# Patient Record
Sex: Male | Born: 1955
Health system: Southern US, Community
[De-identification: ages and names within clinical notes are randomized; demographics above are authoritative.]

## PROBLEM LIST (undated history)

## (undated) DIAGNOSIS — I517 Cardiomegaly: Secondary | ICD-10-CM

## (undated) DIAGNOSIS — R7302 Impaired glucose tolerance (oral): Secondary | ICD-10-CM

## (undated) DIAGNOSIS — E785 Hyperlipidemia, unspecified: Secondary | ICD-10-CM

## (undated) DIAGNOSIS — H9193 Unspecified hearing loss, bilateral: Secondary | ICD-10-CM

## (undated) DIAGNOSIS — I10 Essential (primary) hypertension: Secondary | ICD-10-CM

## (undated) DIAGNOSIS — R0600 Dyspnea, unspecified: Secondary | ICD-10-CM

## (undated) HISTORY — DX: Impaired glucose tolerance (oral): R73.02

## (undated) HISTORY — PX: COLONOSCOPY: SHX174

## (undated) HISTORY — DX: Unspecified hearing loss, bilateral: H91.93

## (undated) HISTORY — DX: Cardiomegaly: I51.7

## (undated) HISTORY — DX: Essential (primary) hypertension: I10

---

## 2017-04-17 ENCOUNTER — Encounter: Payer: Self-pay | Admitting: Internal Medicine

## 2017-05-10 ENCOUNTER — Encounter: Payer: Self-pay | Admitting: *Deleted

## 2017-05-10 ENCOUNTER — Encounter (INDEPENDENT_AMBULATORY_CARE_PROVIDER_SITE_OTHER): Payer: Self-pay

## 2017-05-10 ENCOUNTER — Ambulatory Visit (INDEPENDENT_AMBULATORY_CARE_PROVIDER_SITE_OTHER): Payer: BLUE CROSS/BLUE SHIELD | Admitting: Internal Medicine

## 2017-05-10 VITALS — BP 136/74 | HR 58 | Ht 68.0 in | Wt 242.0 lb

## 2017-05-10 DIAGNOSIS — I10 Essential (primary) hypertension: Secondary | ICD-10-CM | POA: Diagnosis not present

## 2017-05-10 DIAGNOSIS — I517 Cardiomegaly: Secondary | ICD-10-CM | POA: Diagnosis not present

## 2017-05-10 MED ORDER — HYDROCHLOROTHIAZIDE 25 MG PO TABS: 25.0000 mg | ORAL_TABLET | Freq: Every day | ORAL | 6 refills | Status: DC

## 2017-05-10 MED ORDER — PRAVASTATIN SODIUM 40 MG PO TABS: 40.0000 mg | ORAL_TABLET | Freq: Every day | ORAL | 6 refills | Status: DC

## 2017-05-10 MED ORDER — CARVEDILOL 25 MG PO TABS: 25.0000 mg | ORAL_TABLET | Freq: Two times a day (BID) | ORAL | 6 refills | Status: DC

## 2017-05-10 MED ORDER — AMLODIPINE BESY-BENAZEPRIL HCL 10-40 MG PO CAPS: 1.0000 | ORAL_CAPSULE | Freq: Every day | ORAL | 6 refills | Status: DC

## 2017-05-10 NOTE — Patient Instructions (Signed)
Medication Instructions:  Your physician recommends that you continue on your current medications as directed. Please refer to the Current Medication list given to you today.   Labwork: None   Testing/Procedures: None   Follow-Up: Your physician wants you to follow-up in: 6 months with Dr End. (December 2018).  You will receive a reminder letter in the mail two months in advance. If you don't receive a letter, please call our office to schedule the follow-up appointment.        If you need a refill on your cardiac medications before your next appointment, please call your pharmacy.

## 2017-05-10 NOTE — Progress Notes (Signed)
New Outpatient Visit Date: 05/10/2017  Referring Provider: Daiva Eves, MD [facility] [phone] [place], [place] [address]  Chief Complaint: Establish cardiology care  HPI:  [name] is a 61 y.o. male who is being seen today for the evaluation of cardiomegaly at the request of Dr. Lisbeth Gonzalez. He has a history of cardiomegaly and essential hypertension. He was first diagnosed with cardiomegaly a proximally 5 years ago while living in [place]. He presented with progressive shortness of breath and underwent extensive stress testing. The patient reports that his heart was not weekend but enlarged. This subsequently resolved with appropriate treatment of his high blood pressure. Since moving to [place] about a year ago, he has not had any shortness of breath, chest pain, palpitations, lightheadedness, orthopnea, PND, or claudication. He experiences occasional dependent edema when he has been on his feet for an extended period. He also has intermittent pain.  The patient does not recall specifics of his testing in [place], though he believes he had at least one echocardiogram. He does not believe that he underwent cardiac catheterization, though Dr. Onnie Gonzalez note comments on a cath 1.5 years ago that was negative for significant CAD.  --------------------------------------------------------------------------------------------------  Cardiovascular History & Procedures: Cardiovascular Problems:  Cardiomegaly  Risk Factors:  Hypertension, male gender, and age> 33  Cath/PCI:  None available  CV Surgery:  None  EP Procedures and Devices:  None  Non-Invasive Evaluation(s):  None available  Recent CV Pertinent Labs: See below.  --------------------------------------------------------------------------------------------------  Past Medical History:  Diagnosis Date  . Cardiomegaly   . Essential hypertension   . Unspecified hearing loss, bilateral      No past surgical history on file.  Outpatient Encounter Prescriptions as of 05/10/2017  Medication Sig  . amLODipine-benazepril (LOTREL) 10-40 MG capsule Take 1 capsule by mouth daily.  Marland Kitchen aspirin EC 81 MG tablet Take 81 mg by mouth daily.  . carvedilol (COREG) 25 MG tablet Take 25 mg by mouth 2 (two) times daily with a meal.  . hydrochlorothiazide (HYDRODIURIL) 25 MG tablet Take 25 mg by mouth daily.  . pravastatin (PRAVACHOL) 40 MG tablet Take 40 mg by mouth daily.  . [DISCONTINUED] cephALEXin (KEFLEX) 500 MG capsule Take 500 mg by mouth 3 (three) times daily.   No facility-administered encounter medications on file as of 05/10/2017.     Allergies: Patient has no known allergies.  Social History   Social History  . Marital status: Married    Spouse name: N/A  . Number of children: N/A  . Years of education: N/A   Occupational History  . Not on file.   Social History Main Topics  . Smoking status: Former Smoker    Packs/day: 2.00    Years: 20.00    Types: Cigarettes    Quit date: 1998  . Smokeless tobacco: Never Used  . Alcohol use No  . Drug use: No  . Sexual activity: Not on file   Other Topics Concern  . Not on file   Social History Narrative  . No narrative on file    Family History  Problem Relation Age of Onset  . Blindness Mother   . Glaucoma Mother   . Brain cancer Father     Review of Systems: A 12-system review of systems was performed and was negative except as noted in the HPI.  --------------------------------------------------------------------------------------------------  Physical Exam: BP 136/74   Pulse (!) 58   Ht 5\' 8"  (1.727 m)   Wt 242 lb (109.8 kg)  BMI 36.80 kg/m   General:  Obese man, seated comfortably in the exam room. HEENT: No conjunctival pallor or scleral icterus.  Moist mucous membranes.  OP clear. Neck: Supple without lymphadenopathy, thyromegaly, JVD, or HJR.  No carotid bruit. Lungs: Normal work of breathing.   Clear to auscultation bilaterally without wheezes or crackles. Heart: Regular rate and rhythm without murmurs, rubs, or gallops. Unable to assess PMI due to body habitus. Abd: Bowel sounds present.  Soft, NT/ND. I able to assess HSM due to body habitus. Ext: Trace ankle edema bilaterally.  Radial, PT, and DP pulses are 2+ bilaterally Skin: warm and dry without rash. Ill-defined soft tissue mass just below the right scapula; patient reports further w/u is pending. Neuro: CNIII-XII intact.  Strength and fine-touch sensation intact in upper and lower extremities bilaterally. Psych: Normal mood and affect.  EKG:  Normal sinus rhythm with nonspecific intraventricular conduction delay (QRS 128 ms) and nonspecific ST segment changes in the inferior leads.  Outside labs (04/18/17): CBC: WBC 4.7, HGB 13.8, HCT 41.4, platelets 219  CMP: Sodium 145, potassium 3.9, chloride 102, CO2 28, BUN 21, creatinine 0.91, glucose 124, calcium 8.7, AST 18, ALT 18, alkaline phosphatase 74, total bilirubin 0.6, total protein 6.9, albumin 4.0  Hemoglobin A1c 6.2  Lipid panel: Total cholesterol 172, triglyceride 113, HDL 29, LDL 120  --------------------------------------------------------------------------------------------------  ASSESSMENT AND PLAN: Cardiomegaly Details of the patient's diagnosis are unclear. It sounds like he presented with symptoms of heart failure approximately 5 years ago, though he claims that his heart's systolic function was normal. I question if he had significant diastolic dysfunction in the setting of uncontrolled hypertension. He appears euvolemic and well compensated on exam today (NYHA class I). We will continue his current medications. I have also requested records from his former cardiologist in [place]. Once I have reviewed these records, we will determine the need for additional testing, including repeating an echocardiogram.  Hypertension Blood pressure is well controlled  today. Recent labs show normal electrolytes and renal function. Will not make any medication changes at this time.  Follow-up: Return to clinic in 6 months.  Daryl Bush, MD 05/10/2017 9:19 PM

## 2017-05-14 ENCOUNTER — Telehealth: Payer: Self-pay | Admitting: Internal Medicine

## 2017-05-14 NOTE — Telephone Encounter (Signed)
ROI Faxed to [facility].

## 2017-05-22 ENCOUNTER — Telehealth: Payer: Self-pay | Admitting: Internal Medicine

## 2017-05-22 NOTE — Telephone Encounter (Signed)
Release of Information faxed to [facility].

## 2017-06-13 ENCOUNTER — Telehealth: Payer: Self-pay | Admitting: Internal Medicine

## 2017-06-13 NOTE — Telephone Encounter (Signed)
ROI faxed to [facility] @ [phone]

## 2018-04-18 ENCOUNTER — Other Ambulatory Visit: Payer: Self-pay | Admitting: Internal Medicine

## 2018-04-18 DIAGNOSIS — I10 Essential (primary) hypertension: Secondary | ICD-10-CM

## 2018-04-18 NOTE — Telephone Encounter (Signed)
Refill Request.  

## 2018-10-15 ENCOUNTER — Other Ambulatory Visit: Payer: Self-pay | Admitting: Internal Medicine

## 2018-10-15 DIAGNOSIS — I10 Essential (primary) hypertension: Secondary | ICD-10-CM

## 2018-10-16 ENCOUNTER — Ambulatory Visit: Payer: BLUE CROSS/BLUE SHIELD | Admitting: Cardiovascular Disease

## 2018-11-22 ENCOUNTER — Encounter: Payer: Self-pay | Admitting: Cardiovascular Disease

## 2018-11-22 ENCOUNTER — Ambulatory Visit: Payer: BLUE CROSS/BLUE SHIELD | Admitting: Cardiovascular Disease

## 2018-11-22 ENCOUNTER — Encounter (INDEPENDENT_AMBULATORY_CARE_PROVIDER_SITE_OTHER): Payer: Self-pay

## 2018-11-22 VITALS — BP 152/98 | HR 63 | Ht 68.0 in | Wt 259.8 lb

## 2018-11-22 DIAGNOSIS — I10 Essential (primary) hypertension: Secondary | ICD-10-CM | POA: Diagnosis not present

## 2018-11-22 DIAGNOSIS — Z13220 Encounter for screening for lipoid disorders: Secondary | ICD-10-CM

## 2018-11-22 LAB — BASIC METABOLIC PANEL
BUN/Creatinine Ratio: 14 (ref 10–24)
BUN: 13 mg/dL (ref 8–27)
CALCIUM: 8.9 mg/dL (ref 8.6–10.2)
CO2: 23 mmol/L (ref 20–29)
CREATININE: 0.92 mg/dL (ref 0.76–1.27)
Chloride: 105 mmol/L (ref 96–106)
GFR calc Af Amer: 101 mL/min/{1.73_m2} (ref >59)
GFR calc non Af Amer: 88 mL/min/{1.73_m2} (ref >59)
GLUCOSE: 125 mg/dL — AB (ref 65–99)
Potassium: 3.8 mmol/L (ref 3.5–5.2)
Sodium: 141 mmol/L (ref 134–144)

## 2018-11-22 LAB — LIPID PANEL
Chol/HDL Ratio: 5.9 ratio — ABNORMAL HIGH (ref 0.0–5.0)
Cholesterol, Total: 170 mg/dL (ref 100–199)
HDL: 29 mg/dL — ABNORMAL LOW (ref >39)
LDL Calculated: 115 mg/dL — ABNORMAL HIGH (ref 0–99)
Triglycerides: 131 mg/dL (ref 0–149)
VLDL Cholesterol Cal: 26 mg/dL (ref 5–40)

## 2018-11-22 LAB — HEPATIC FUNCTION PANEL
ALBUMIN: 4.2 g/dL (ref 3.6–4.8)
ALK PHOS: 89 IU/L (ref 39–117)
ALT: 26 IU/L (ref 0–44)
AST: 15 IU/L (ref 0–40)
BILIRUBIN TOTAL: 0.5 mg/dL (ref 0.0–1.2)
BILIRUBIN, DIRECT: 0.13 mg/dL (ref 0.00–0.40)
TOTAL PROTEIN: 6.6 g/dL (ref 6.0–8.5)

## 2018-11-22 MED ORDER — CARVEDILOL 25 MG PO TABS: 25.0000 mg | ORAL_TABLET | Freq: Two times a day (BID) | ORAL | 3 refills | Status: AC

## 2018-11-22 MED ORDER — AMLODIPINE BESY-BENAZEPRIL HCL 10-40 MG PO CAPS: 1.0000 | ORAL_CAPSULE | Freq: Every day | ORAL | 3 refills | Status: AC

## 2018-11-22 MED ORDER — HYDROCHLOROTHIAZIDE 25 MG PO TABS: 25.0000 mg | ORAL_TABLET | Freq: Every day | ORAL | 3 refills | Status: AC

## 2018-11-22 NOTE — Progress Notes (Signed)
Cardiology Office Note:    Date:  11/22/2018   ID:  Daryl Gonzalez, DOB February 15, 1956, MRN [id number]  PCP:  Patient, No Pcp Per  Cardiologist:  Mertie Moores, MD  Electrophysiologist:  None   Referring MD: No ref. provider found   Chief Complaint  Patient presents with  . Hypertension     History of Present Illness:    Daryl Gonzalez is a 62 y.o. male with a hx of hypertension and cardiomegaly.  Previously a patient of Dr. Saunders Revel and and is seeing me today for the first time.  BP is well controlled at home Is a bit elevated today - has some issues at home this am  No CP , no dyspnea  Just retired this past week Came to [facility] from [place], [place] here for 2 years and now has retired.    Was an Agricultural consultant of cars - worked for [employer]     Past Medical History:  Diagnosis Date  . Cardiomegaly   . Essential hypertension   . Impaired glucose tolerance   . Unspecified hearing loss, bilateral     History reviewed. No pertinent surgical history.  Current Medications: Current Meds  Medication Sig  . amLODipine-benazepril (LOTREL) 10-40 MG capsule Take 1 capsule by mouth daily.  Marland Kitchen aspirin EC 81 MG tablet Take 81 mg by mouth daily.  . carvedilol (COREG) 25 MG tablet Take 1 tablet (25 mg total) by mouth 2 (two) times daily with a meal.  . hydrochlorothiazide (HYDRODIURIL) 25 MG tablet Take 1 tablet (25 mg total) by mouth daily.  . [DISCONTINUED] amLODipine-benazepril (LOTREL) 10-40 MG capsule Take 1 capsule by mouth daily. Please keep upcoming appt in December with Dr. Acie Fredrickson for future refills. Thank you  . [DISCONTINUED] carvedilol (COREG) 25 MG tablet Take 1 tablet (25 mg total) by mouth 2 (two) times daily with a meal. Please keep upcoming appt. Thank you  . [DISCONTINUED] hydrochlorothiazide (HYDRODIURIL) 25 MG tablet TAKE ONE TABLET BY MOUTH DAILY     Allergies:   Patient has no known allergies.   Social History   Socioeconomic History  . Marital status:  Married    Spouse name: Not on file  . Number of children: Not on file  . Years of education: Not on file  . Highest education level: Not on file  Occupational History  . Not on file  Social Needs  . Financial resource strain: Not on file  . Food insecurity:    Worry: Not on file    Inability: Not on file  . Transportation needs:    Medical: Not on file    Non-medical: Not on file  Tobacco Use  . Smoking status: Former Smoker    Packs/day: 2.00    Years: 20.00    Pack years: 40.00    Types: Cigarettes    Last attempt to quit: 1998    Years since quitting: 21.9  . Smokeless tobacco: Never Used  Substance and Sexual Activity  . Alcohol use: No  . Drug use: No  . Sexual activity: Not on file  Lifestyle  . Physical activity:    Days per week: Not on file    Minutes per session: Not on file  . Stress: Not on file  Relationships  . Social connections:    Talks on phone: Not on file    Gets together: Not on file    Attends religious service: Not on file    Active member of club or organization: Not on file  Attends meetings of clubs or organizations: Not on file    Relationship status: Not on file  Other Topics Concern  . Not on file  Social History Narrative  . Not on file     Family History: The patient's family history includes Blindness in his mother; Brain cancer in his father; Glaucoma in his mother.  ROS:   Please see the history of present illness.     All other systems reviewed and are negative.  EKGs/Labs/Other Studies Reviewed:    The following studies were reviewed today:  EKG:   Dec. 20, 2019:     NSR at 63.   , NS ST abn.   Recent Labs: 11/22/2018: ALT 26; BUN 13; Creatinine, Ser 0.92; Potassium 3.8; Sodium 141  Recent Lipid Panel    Component Value Date/Time   CHOL 170 11/22/2018 1059   TRIG 131 11/22/2018 1059   HDL 29 (L) 11/22/2018 1059   CHOLHDL 5.9 (H) 11/22/2018 1059   LDLCALC 115 (H) 11/22/2018 1059    Physical Exam:    VS:   BP (!) 152/98 (BP Location: Right Arm, Patient Position: Sitting, Cuff Size: Normal)   Pulse 63   Ht 5\' 8"  (1.727 m)   Wt 259 lb 12.8 oz (117.8 kg)   SpO2 98%   BMI 39.50 kg/m     Wt Readings from Last 3 Encounters:  11/22/18 259 lb 12.8 oz (117.8 kg)  05/10/17 242 lb (109.8 kg)      ASSESSMENT:    1. Lipid screening   2. Essential hypertension    PLAN:    In order of problems listed above:  1. Essential hypertension: Daryl Gonzalez presents for further evaluation of his hypertension.  He is not quite as careful with his diet his diet as he should be.  He also has not been exercising.  I have encouraged him to get back to the gym and exercise on a regular basis.  He has gone to [place] there in the past and likes it quite a bit.  See him in 6 months for a follow-up office visit.  We will check lipids, liver enzymes, basic metabolic profile today for screening purposes.   Medication Adjustments/Labs and Tests Ordered: Current medicines are reviewed at length with the patient today.  Concerns regarding medicines are outlined above.  Orders Placed This Encounter  Procedures  . Lipid Profile  . Basic Metabolic Panel (BMET)  . Hepatic function panel  . EKG 12-Lead   Meds ordered this encounter  Medications  . hydrochlorothiazide (HYDRODIURIL) 25 MG tablet    Sig: Take 1 tablet (25 mg total) by mouth daily.    Dispense:  90 tablet    Refill:  3  . amLODipine-benazepril (LOTREL) 10-40 MG capsule    Sig: Take 1 capsule by mouth daily.    Dispense:  90 capsule    Refill:  3  . carvedilol (COREG) 25 MG tablet    Sig: Take 1 tablet (25 mg total) by mouth 2 (two) times daily with a meal.    Dispense:  180 tablet    Refill:  3    Patient Instructions  Medication Instructions:  Your physician recommends that you continue on your current medications as directed. Please refer to the Current Medication list given to you today.  If you need a refill on your cardiac medications before  your next appointment, please call your pharmacy.    Lab work: TODAY - cholesterol, liver panel, basic metabolic panel  Testing/Procedures: None Ordered   Follow-Up: At [facility], you and your health needs are our priority.  As part of our continuing mission to provide you with exceptional heart care, we have created designated Provider Care Teams.  These Care Teams include your primary Cardiologist (physician) and Advanced Practice Providers (APPs -  Physician Assistants and Nurse Practitioners) who all work together to provide you with the care you need, when you need it. You will need a follow up appointment in:  6 months.  Please call our office 2 months in advance to schedule this appointment.  You may see Mertie Moores, MD or one of the following Advanced Practice Providers on your designated Care Team: Richardson Dopp, PA-C [place], [place] . Daune Perch, NP       Signed, Mertie Moores, MD  11/22/2018 5:57 PM    [place]

## 2018-11-22 NOTE — Patient Instructions (Addendum)
Medication Instructions:  Your physician recommends that you continue on your current medications as directed. Please refer to the Current Medication list given to you today.  If you need a refill on your cardiac medications before your next appointment, please call your pharmacy.    Lab work: TODAY - cholesterol, liver panel, basic metabolic panel    Testing/Procedures: None Ordered   Follow-Up: At [facility], you and your health needs are our priority.  As part of our continuing mission to provide you with exceptional heart care, we have created designated Provider Care Teams.  These Care Teams include your primary Cardiologist (physician) and Advanced Practice Providers (APPs -  Physician Assistants and Nurse Practitioners) who all work together to provide you with the care you need, when you need it. You will need a follow up appointment in:  6 months.  Please call our office 2 months in advance to schedule this appointment.  You may see Mertie Moores, MD or one of the following Advanced Practice Providers on your designated Care Team: Richardson Dopp, PA-C [place], [place] . Daune Perch, NP

## 2019-01-16 DIAGNOSIS — D229 Melanocytic nevi, unspecified: Secondary | ICD-10-CM | POA: Diagnosis not present

## 2019-01-16 DIAGNOSIS — L570 Actinic keratosis: Secondary | ICD-10-CM | POA: Diagnosis not present

## 2019-01-16 DIAGNOSIS — Z23 Encounter for immunization: Secondary | ICD-10-CM | POA: Diagnosis not present

## 2019-01-16 DIAGNOSIS — H903 Sensorineural hearing loss, bilateral: Secondary | ICD-10-CM | POA: Diagnosis not present

## 2019-01-22 DIAGNOSIS — I10 Essential (primary) hypertension: Secondary | ICD-10-CM | POA: Diagnosis not present

## 2019-01-22 DIAGNOSIS — Z1211 Encounter for screening for malignant neoplasm of colon: Secondary | ICD-10-CM | POA: Diagnosis not present

## 2019-01-22 DIAGNOSIS — E785 Hyperlipidemia, unspecified: Secondary | ICD-10-CM | POA: Diagnosis not present

## 2019-01-22 DIAGNOSIS — Z125 Encounter for screening for malignant neoplasm of prostate: Secondary | ICD-10-CM | POA: Diagnosis not present

## 2019-01-22 DIAGNOSIS — Z0000 Encounter for general adult medical examination without abnormal findings: Secondary | ICD-10-CM | POA: Diagnosis not present

## 2019-01-22 DIAGNOSIS — N529 Male erectile dysfunction, unspecified: Secondary | ICD-10-CM | POA: Diagnosis not present

## 2019-01-23 DIAGNOSIS — H903 Sensorineural hearing loss, bilateral: Secondary | ICD-10-CM | POA: Diagnosis not present

## 2019-01-23 DIAGNOSIS — H906 Mixed conductive and sensorineural hearing loss, bilateral: Secondary | ICD-10-CM | POA: Diagnosis not present

## 2019-02-19 ENCOUNTER — Encounter: Payer: Self-pay | Admitting: Gastroenterology

## 2019-04-02 ENCOUNTER — Other Ambulatory Visit: Payer: Self-pay | Admitting: *Deleted

## 2019-04-02 NOTE — Patient Outreach (Signed)
[place] [facility]) Care Management  04/02/2019  Daryl Gonzalez [birth year] [id number]   RN Health coach sent a welcome letter. As a [employer] of [employer] health plan.  Plan: Next follow up outreach within the month of June  [name] [place] [facility] Care Management [phone]

## 2019-04-08 ENCOUNTER — Ambulatory Visit: Admit: 2019-04-08 | Payer: BLUE CROSS/BLUE SHIELD | Admitting: Internal Medicine

## 2019-04-08 SURGERY — COLONOSCOPY WITH PROPOFOL
Anesthesia: General

## 2019-05-23 ENCOUNTER — Other Ambulatory Visit: Payer: Self-pay | Admitting: *Deleted

## 2019-05-23 NOTE — Patient Outreach (Signed)
[place] [facility]) Care Management  05/23/2019  Daryl Gonzalez Dec 31, 1955 [id number]  RN Health Coach is closing this program. Consumer is enrolled in [place] [facility] external program.  [place] Care Management [phone]

## 2019-05-29 ENCOUNTER — Ambulatory Visit: Payer: BLUE CROSS/BLUE SHIELD | Admitting: *Deleted

## 2019-08-26 DIAGNOSIS — E785 Hyperlipidemia, unspecified: Secondary | ICD-10-CM | POA: Diagnosis not present

## 2019-08-26 DIAGNOSIS — Z1211 Encounter for screening for malignant neoplasm of colon: Secondary | ICD-10-CM | POA: Diagnosis not present

## 2019-08-26 DIAGNOSIS — I517 Cardiomegaly: Secondary | ICD-10-CM | POA: Diagnosis not present

## 2019-08-26 DIAGNOSIS — I10 Essential (primary) hypertension: Secondary | ICD-10-CM | POA: Diagnosis not present

## 2019-09-01 DIAGNOSIS — I10 Essential (primary) hypertension: Secondary | ICD-10-CM | POA: Diagnosis not present

## 2019-09-01 DIAGNOSIS — E782 Mixed hyperlipidemia: Secondary | ICD-10-CM | POA: Diagnosis not present

## 2019-09-01 DIAGNOSIS — R0602 Shortness of breath: Secondary | ICD-10-CM | POA: Diagnosis not present

## 2019-09-01 DIAGNOSIS — Z7689 Persons encountering health services in other specified circumstances: Secondary | ICD-10-CM | POA: Diagnosis not present

## 2019-09-01 DIAGNOSIS — I517 Cardiomegaly: Secondary | ICD-10-CM | POA: Diagnosis not present

## 2019-09-03 DIAGNOSIS — M25472 Effusion, left ankle: Secondary | ICD-10-CM | POA: Diagnosis not present

## 2019-09-03 DIAGNOSIS — K439 Ventral hernia without obstruction or gangrene: Secondary | ICD-10-CM | POA: Diagnosis not present

## 2019-09-03 DIAGNOSIS — M25471 Effusion, right ankle: Secondary | ICD-10-CM | POA: Diagnosis not present

## 2019-09-03 DIAGNOSIS — I781 Nevus, non-neoplastic: Secondary | ICD-10-CM | POA: Diagnosis not present

## 2019-09-11 DIAGNOSIS — R0602 Shortness of breath: Secondary | ICD-10-CM | POA: Diagnosis not present

## 2019-09-17 DIAGNOSIS — E782 Mixed hyperlipidemia: Secondary | ICD-10-CM | POA: Diagnosis not present

## 2019-09-17 DIAGNOSIS — R9431 Abnormal electrocardiogram [ECG] [EKG]: Secondary | ICD-10-CM | POA: Diagnosis not present

## 2019-09-17 DIAGNOSIS — R0602 Shortness of breath: Secondary | ICD-10-CM | POA: Diagnosis not present

## 2019-09-17 DIAGNOSIS — I10 Essential (primary) hypertension: Secondary | ICD-10-CM | POA: Diagnosis not present

## 2019-09-17 DIAGNOSIS — I119 Hypertensive heart disease without heart failure: Secondary | ICD-10-CM | POA: Diagnosis not present

## 2019-10-14 DIAGNOSIS — H2513 Age-related nuclear cataract, bilateral: Secondary | ICD-10-CM | POA: Diagnosis not present

## 2019-10-31 ENCOUNTER — Ambulatory Visit
Admission: EM | Admit: 2019-10-31 | Discharge: 2019-10-31 | Disposition: A | Discharge status: Home / Self Care | Payer: PPO | Attending: Emergency Medicine | Admitting: Emergency Medicine

## 2019-10-31 DIAGNOSIS — H00014 Hordeolum externum left upper eyelid: Secondary | ICD-10-CM | POA: Diagnosis not present

## 2019-10-31 DIAGNOSIS — H1032 Unspecified acute conjunctivitis, left eye: Secondary | ICD-10-CM

## 2019-10-31 MED ORDER — POLYMYXIN B-TRIMETHOPRIM 10000-0.1 UNIT/ML-% OP SOLN: 1.0000 [drp] | Freq: Four times a day (QID) | OPHTHALMIC | 0 refills | Status: AC

## 2019-10-31 NOTE — Discharge Instructions (Addendum)
Use the eyedrops as directed.    Go to the emergency department if you have acute eye pain or changes in your vision.    Follow-up with your eye care provider on Monday if your symptoms are not improving.

## 2019-10-31 NOTE — ED Provider Notes (Signed)
[facility]    CSN: [id number] Arrival date & time: 10/31/19  1027      History   Chief Complaint Chief Complaint  Patient presents with  . Eye Problem    HPI Daryl Gonzalez is a 63 y.o. male.   Patient presents with swelling of his left upper eyelid and eye redness x 2 days.  He has been treating this with warm compresses.  He denies eye pain or changes in his vision.  He reports drainage and crusting in the mornings.  He states his right eye is now becoming irritated also.  He denies fever, chills, sore throat, cough, or other symptoms.  The history is provided by the patient.    Past Medical History:  Diagnosis Date  . Cardiomegaly   . Essential hypertension   . Impaired glucose tolerance   . Unspecified hearing loss, bilateral     Patient Active Problem List   Diagnosis Date Noted  . Cardiomegaly 05/10/2017  . Essential hypertension 05/10/2017    History reviewed. No pertinent surgical history.     Home Medications    Prior to Admission medications   Medication Sig Start Date End Date Taking? Authorizing Provider  amLODipine-benazepril (LOTREL) 10-40 MG capsule Take 1 capsule by mouth daily. 11/22/18  Yes Nahser, Wonda Cheng, MD  aspirin EC 81 MG tablet Take 81 mg by mouth daily.   Yes [provider]  carvedilol (COREG) 25 MG tablet Take 1 tablet (25 mg total) by mouth 2 (two) times daily with a meal. 11/22/18  Yes Nahser, Wonda Cheng, MD  hydrochlorothiazide (HYDRODIURIL) 25 MG tablet Take 1 tablet (25 mg total) by mouth daily. 11/22/18  Yes Nahser, Wonda Cheng, MD  trimethoprim-polymyxin b (POLYTRIM) ophthalmic solution Place 1 drop into both eyes 4 (four) times daily for 7 days. 10/31/19 11/07/19  Sharion Balloon, NP    Family History Family History  Problem Relation Age of Onset  . Blindness Mother   . Glaucoma Mother   . Brain cancer Father     Social History Social History   Tobacco Use  . Smoking status: Former Smoker    Packs/day:  2.00    Years: 20.00    Pack years: 40.00    Types: Cigarettes    Quit date: 1998    Years since quitting: 22.9  . Smokeless tobacco: Never Used  Substance Use Topics  . Alcohol use: No  . Drug use: No     Allergies   Patient has no known allergies.   Review of Systems Review of Systems  Constitutional: Negative for chills and fever.  HENT: Negative for ear pain and sore throat.   Eyes: Positive for discharge and redness. Negative for pain and visual disturbance.  Respiratory: Negative for cough and shortness of breath.   Cardiovascular: Negative for chest pain and palpitations.  Gastrointestinal: Negative for abdominal pain and vomiting.  Genitourinary: Negative for dysuria and hematuria.  Musculoskeletal: Negative for arthralgias and back pain.  Skin: Negative for color change and rash.  Neurological: Negative for seizures and syncope.  All other systems reviewed and are negative.    Physical Exam Triage Vital Signs ED Triage Vitals  Enc Vitals Group     BP 10/31/19 1030 (!) 146/82     Pulse Rate 10/31/19 1030 64     Resp 10/31/19 1030 18     Temp 10/31/19 1030 98.1 F (36.7 C)     Temp src --      SpO2 10/31/19  1030 95 %     Weight --      Height --      Head Circumference --      Peak Flow --      Pain Score 10/31/19 1028 0     Pain Loc --      Pain Edu? --      Excl. in [place]? --    No data found.  Updated Vital Signs BP (!) 146/82   Pulse 64   Temp 98.1 F (36.7 C)   Resp 18   SpO2 95%   Visual Acuity Right Eye Distance: 20/40 Left Eye Distance: 20/60 Bilateral Distance: 20/60  Right Eye Near:   Left Eye Near:    Bilateral Near:     Physical Exam Vitals signs and nursing note reviewed.  Constitutional:      Appearance: He is well-developed.  HENT:     Head: Normocephalic and atraumatic.     Right Ear: Tympanic membrane normal.     Left Ear: Tympanic membrane normal.     Nose: Nose normal.     Mouth/Throat:     Mouth: Mucous  membranes are moist.     Pharynx: Oropharynx is clear.  Eyes:     General: Vision grossly intact.     Extraocular Movements: Extraocular movements intact.     Conjunctiva/sclera:     Right eye: Right conjunctiva is not injected. No exudate.    Left eye: Left conjunctiva is injected. No exudate.    Pupils: Pupils are equal, round, and reactive to light.      Comments: Stye  Neck:     Musculoskeletal: Neck supple.  Cardiovascular:     Rate and Rhythm: Normal rate and regular rhythm.     Heart sounds: No murmur.  Pulmonary:     Effort: Pulmonary effort is normal. No respiratory distress.     Breath sounds: Normal breath sounds.  Abdominal:     General: Bowel sounds are normal.     Palpations: Abdomen is soft.     Tenderness: There is no abdominal tenderness. There is no guarding or rebound.  Skin:    General: Skin is warm and dry.  Neurological:     General: No focal deficit present.     Mental Status: He is alert and oriented to person, place, and time.      UC Treatments / Results  Labs (all labs ordered are listed, but only abnormal results are displayed) Labs Reviewed - No data to display  EKG   Radiology No results found.  Procedures Procedures (including critical care time)  Medications Ordered in UC Medications - No data to display  Initial Impression / Assessment and Plan / UC Course  I have reviewed the triage vital signs and the nursing notes.  Pertinent labs & imaging results that were available during my care of the patient were reviewed by me and considered in my medical decision making (see chart for details).     Left upper eyelid stye.  Acute conjunctivitis of the left eye.  Treating with Polytrim eyedrops.  Instructed patient to continue using warm compresses to his eye.  Instructed him to go to the emergency department if he has acute eye pain or changes in his vision.  Discussed that he should follow-up with his eye doctor on Monday if his  symptoms are not improving.  Patient agrees to this plan of care.    Final Clinical Impressions(s) / UC Diagnoses   Final diagnoses:  Hordeolum externum of left upper eyelid  Acute bacterial conjunctivitis of left eye     Discharge Instructions     Use the eyedrops as directed.    Go to the emergency department if you have acute eye pain or changes in your vision.    Follow-up with your eye care provider on Monday if your symptoms are not improving.       ED Prescriptions    Medication Sig Dispense Auth. Provider   trimethoprim-polymyxin b (POLYTRIM) ophthalmic solution Place 1 drop into both eyes 4 (four) times daily for 7 days. 10 mL Sharion Balloon, NP     PDMP not reviewed this encounter.   Sharion Balloon, NP 10/31/19 1104

## 2019-10-31 NOTE — ED Triage Notes (Signed)
Pt presents with complaints of swelling to the left eye. Pt's daughter told him it looked like a stye. The patient states the swelling has gotten worse over the last three days. Denies relief with otc treatment.

## 2019-11-17 ENCOUNTER — Other Ambulatory Visit
Admission: RE | Admit: 2019-11-17 | Discharge: 2019-11-17 | Disposition: A | Discharge status: Home / Self Care | Payer: PPO | Source: Ambulatory Visit | Attending: Internal Medicine | Admitting: Internal Medicine

## 2019-11-17 ENCOUNTER — Other Ambulatory Visit: Payer: Self-pay

## 2019-11-17 DIAGNOSIS — Z20828 Contact with and (suspected) exposure to other viral communicable diseases: Secondary | ICD-10-CM | POA: Diagnosis not present

## 2019-11-17 DIAGNOSIS — Z01812 Encounter for preprocedural laboratory examination: Secondary | ICD-10-CM | POA: Diagnosis not present

## 2019-11-17 LAB — SARS CORONAVIRUS 2 (TAT 6-24 HRS): SARS Coronavirus 2: NEGATIVE

## 2019-11-20 ENCOUNTER — Ambulatory Visit: Payer: PPO | Admitting: Anesthesiology

## 2019-11-20 ENCOUNTER — Other Ambulatory Visit: Payer: Self-pay

## 2019-11-20 ENCOUNTER — Encounter: Payer: Self-pay | Admitting: Internal Medicine

## 2019-11-20 ENCOUNTER — Ambulatory Visit
Admission: RE | Admit: 2019-11-20 | Discharge: 2019-11-20 | Disposition: A | Discharge status: Home / Self Care | Payer: PPO | Source: Ambulatory Visit | Attending: Internal Medicine | Admitting: Internal Medicine

## 2019-11-20 ENCOUNTER — Encounter
Admission: RE | Disposition: A | Discharge status: Home / Self Care | Payer: Self-pay | Source: Ambulatory Visit | Attending: Internal Medicine

## 2019-11-20 DIAGNOSIS — I10 Essential (primary) hypertension: Secondary | ICD-10-CM | POA: Insufficient documentation

## 2019-11-20 DIAGNOSIS — K5730 Diverticulosis of large intestine without perforation or abscess without bleeding: Secondary | ICD-10-CM | POA: Diagnosis not present

## 2019-11-20 DIAGNOSIS — Z79899 Other long term (current) drug therapy: Secondary | ICD-10-CM | POA: Insufficient documentation

## 2019-11-20 DIAGNOSIS — K635 Polyp of colon: Secondary | ICD-10-CM | POA: Diagnosis not present

## 2019-11-20 DIAGNOSIS — D120 Benign neoplasm of cecum: Secondary | ICD-10-CM | POA: Diagnosis not present

## 2019-11-20 DIAGNOSIS — Z6839 Body mass index (BMI) 39.0-39.9, adult: Secondary | ICD-10-CM | POA: Diagnosis not present

## 2019-11-20 DIAGNOSIS — Z7982 Long term (current) use of aspirin: Secondary | ICD-10-CM | POA: Insufficient documentation

## 2019-11-20 DIAGNOSIS — K649 Unspecified hemorrhoids: Secondary | ICD-10-CM | POA: Diagnosis not present

## 2019-11-20 DIAGNOSIS — K5790 Diverticulosis of intestine, part unspecified, without perforation or abscess without bleeding: Secondary | ICD-10-CM | POA: Diagnosis not present

## 2019-11-20 DIAGNOSIS — K640 First degree hemorrhoids: Secondary | ICD-10-CM | POA: Insufficient documentation

## 2019-11-20 DIAGNOSIS — Z1211 Encounter for screening for malignant neoplasm of colon: Secondary | ICD-10-CM | POA: Insufficient documentation

## 2019-11-20 DIAGNOSIS — Z87891 Personal history of nicotine dependence: Secondary | ICD-10-CM | POA: Diagnosis not present

## 2019-11-20 DIAGNOSIS — D122 Benign neoplasm of ascending colon: Secondary | ICD-10-CM | POA: Diagnosis not present

## 2019-11-20 HISTORY — PX: COLONOSCOPY WITH PROPOFOL: SHX5780

## 2019-11-20 SURGERY — COLONOSCOPY WITH PROPOFOL
Anesthesia: General

## 2019-11-20 MED ORDER — SODIUM CHLORIDE 0.9 % IV SOLN: INTRAVENOUS | Status: DC

## 2019-11-20 MED ORDER — PROPOFOL 500 MG/50ML IV EMUL
INTRAVENOUS | Status: DC | PRN
  Administered 2019-11-20: 100 ug/kg/min via INTRAVENOUS

## 2019-11-20 MED ORDER — LIDOCAINE HCL (CARDIAC) PF 100 MG/5ML IV SOSY
PREFILLED_SYRINGE | INTRAVENOUS | Status: DC | PRN
  Administered 2019-11-20: 100 mg via INTRAVENOUS

## 2019-11-20 MED ORDER — PROPOFOL 500 MG/50ML IV EMUL
INTRAVENOUS | Status: AC
  Filled 2019-11-20: qty 50

## 2019-11-20 MED ORDER — PHENYLEPHRINE HCL (PRESSORS) 10 MG/ML IV SOLN
INTRAVENOUS | Status: AC
  Filled 2019-11-20: qty 1

## 2019-11-20 MED ORDER — PHENYLEPHRINE HCL (PRESSORS) 10 MG/ML IV SOLN
INTRAVENOUS | Status: DC | PRN
  Administered 2019-11-20 (×3): 100 ug via INTRAVENOUS

## 2019-11-20 MED ORDER — PROPOFOL 10 MG/ML IV BOLUS
INTRAVENOUS | Status: DC | PRN
  Administered 2019-11-20: 30 mg via INTRAVENOUS
  Administered 2019-11-20: 70 mg via INTRAVENOUS
  Administered 2019-11-20: 30 mg via INTRAVENOUS

## 2019-11-20 MED ORDER — LIDOCAINE HCL (PF) 2 % IJ SOLN
INTRAMUSCULAR | Status: AC
  Filled 2019-11-20: qty 10

## 2019-11-20 NOTE — Interval H&P Note (Signed)
History and Physical Interval Note:  11/20/2019 11:15 AM  Daryl Gonzalez  has presented today for surgery, with the diagnosis of CCA SCREEN.  The various methods of treatment have been discussed with the patient and family. After consideration of risks, benefits and other options for treatment, the patient has consented to  Procedure(s): COLONOSCOPY WITH PROPOFOL (N/A) as a surgical intervention.  The patient's history has been reviewed, patient examined, no change in status, stable for surgery.  I have reviewed the patient's chart and labs.  Questions were answered to the patient's satisfaction.     [place], [place]

## 2019-11-20 NOTE — Transfer of Care (Signed)
Immediate Anesthesia Transfer of Care Note  Patient: Daryl Gonzalez  Procedure(s) Performed: COLONOSCOPY WITH PROPOFOL (N/A )  Patient Location: PACU and Endoscopy Unit  Anesthesia Type:General  Level of Consciousness: awake, oriented, drowsy and patient cooperative  Airway & Oxygen Therapy: Patient Spontanous Breathing  Post-op Assessment: Report given to RN and Post -op Vital signs reviewed and stable  Post vital signs: Reviewed and stable  Last Vitals:  Vitals Value Taken Time  BP 109/74 11/20/19 1150  Temp 36.4 C 11/20/19 1150  Pulse 59 11/20/19 1152  Resp 20 11/20/19 1152  SpO2 97 % 11/20/19 1152  Vitals shown include unvalidated device data.  Last Pain:  Vitals:   11/20/19 1150  TempSrc: Temporal  PainSc: 0-No pain         Complications: No apparent anesthesia complications

## 2019-11-20 NOTE — Op Note (Signed)
[facility] Gastroenterology Patient Name: Daryl Gonzalez Procedure Date: 11/20/2019 11:14 AM MRN: [id number] Account #: [id number] Date of Birth: Jul 23, 1956 Admit Type: Outpatient Age: 63 Room: [facility] ENDO ROOM 1 Gender: Male Note Status: Finalized Procedure:             Colonoscopy Indications:           Screening for colorectal malignant neoplasm Providers:             Benay Pike. [name] MD, MD Referring MD:          Irven Easterly. Kary Kos, MD (Referring MD) Medicines:             Propofol per Anesthesia Complications:         No immediate complications. Procedure:             Pre-Anesthesia Assessment:                        - The risks and benefits of the procedure and the                         sedation options and risks were discussed with the                         patient. All questions were answered and informed                         consent was obtained.                        - Patient identification and proposed procedure were                         verified prior to the procedure by the nurse. The                         procedure was verified in the procedure room.                        - ASA Grade Assessment: III - A patient with severe                         systemic disease.                        - After reviewing the risks and benefits, the patient                         was deemed in satisfactory condition to undergo the                         procedure.                        After obtaining informed consent, the colonoscope was                         passed under direct vision. Throughout the procedure,                         the patient's blood pressure,  pulse, and oxygen                         saturations were monitored continuously. The                         Colonoscope was introduced through the anus and                         advanced to the the cecum, identified by appendiceal                         orifice and ileocecal  valve. The colonoscopy was                         performed without difficulty. The patient tolerated                         the procedure well. The quality of the bowel                         preparation was adequate. The ileocecal valve,                         appendiceal orifice, and rectum were photographed. Findings:      The perianal and digital rectal examinations were normal. Pertinent       negatives include normal sphincter tone and no palpable rectal lesions.      A few small-mouthed diverticula were found in the sigmoid colon.      Three semi-pedunculated polyps were found in the ascending colon and       cecum. The polyps were 4 to 7 mm in size. These polyps were removed with       a cold snare. Resection and retrieval were complete.      Non-bleeding internal hemorrhoids were found during retroflexion. The       hemorrhoids were Grade I (internal hemorrhoids that do not prolapse).      The exam was otherwise without abnormality. Impression:            - Diverticulosis in the sigmoid colon.                        - Three 4 to 7 mm polyps in the ascending colon and in                         the cecum, removed with a cold snare. Resected and                         retrieved.                        - Non-bleeding internal hemorrhoids.                        - The examination was otherwise normal. Recommendation:        - Patient has a contact number available for                         emergencies. The signs and symptoms of potential  delayed complications were discussed with the patient.                         Return to normal activities tomorrow. Written                         discharge instructions were provided to the patient.                        - Resume previous diet.                        - Continue present medications.                        - Repeat colonoscopy is recommended for surveillance.                         The colonoscopy  date will be determined after                         pathology results from today's exam become available                         for review.                        - Return to GI office PRN.                        - The findings and recommendations were discussed with                         the patient. Procedure Code(s):     --- Professional ---                        [phone], Colonoscopy, flexible; with removal of                         tumor(s), polyp(s), or other lesion(s) by snare                         technique Diagnosis Code(s):     --- Professional ---                        K57.30, Diverticulosis of large intestine without                         perforation or abscess without bleeding                        K63.5, Polyp of colon                        K64.0, First degree hemorrhoids                        Z12.11, Encounter for screening for malignant neoplasm                         of colon CPT copyright  2019 American Medical Association. All rights reserved. The codes documented in this report are preliminary and upon coder review may  be revised to meet current compliance requirements. Efrain Sella MD, MD 11/20/2019 11:48:18 AM This report has been signed electronically. Number of Addenda: 0 Note Initiated On: 11/20/2019 11:14 AM Scope Withdrawal Time: 0 hours 13 minutes 54 seconds  Total Procedure Duration: 0 hours 21 minutes 23 seconds  Estimated Blood Loss:  Estimated blood loss: none.      [facility]

## 2019-11-20 NOTE — Anesthesia Post-op Follow-up Note (Signed)
Anesthesia QCDR form completed.        

## 2019-11-20 NOTE — Anesthesia Postprocedure Evaluation (Signed)
Anesthesia Post Note  Patient: Daryl Gonzalez  Procedure(s) Performed: COLONOSCOPY WITH PROPOFOL (N/A )  Patient location during evaluation: PACU Anesthesia Type: General Level of consciousness: awake and alert Pain management: pain level controlled Vital Signs Assessment: post-procedure vital signs reviewed and stable Respiratory status: spontaneous breathing, nonlabored ventilation, respiratory function stable and patient connected to nasal cannula oxygen Cardiovascular status: blood pressure returned to baseline and stable Postop Assessment: no apparent nausea or vomiting Anesthetic complications: no     Last Vitals:  Vitals:   11/20/19 1200 11/20/19 1210  BP: 111/73 115/75  Pulse: (!) 57 (!) 57  Resp: (!) 22 17  Temp:    SpO2: 97% 99%    Last Pain:  Vitals:   11/20/19 1210  TempSrc:   PainSc: 0-No pain                 Molli Barrows

## 2019-11-20 NOTE — H&P (Signed)
Outpatient short stay form Pre-procedure 11/20/2019 11:13 AM Daryl Gonzalez K. [name], M.D.  Primary Physician:  [place] Pink, M.D.  Reason for visit: Colon cancer screening.  History of present illness:  Patient presents for colonoscopy for colon cancer screening. The patient denies complaints of abdominal pain, significant change in bowel habits, or rectal bleeding.      Current Facility-Administered Medications:  .  0.9 %  sodium chloride infusion, , Intravenous, Continuous, [place], Benay Pike, MD, Last Rate: 20 mL/hr at 11/20/19 1033, New Bag at 11/20/19 1033  Medications Prior to Admission  Medication Sig Dispense Refill Last Dose  . amLODipine-benazepril (LOTREL) 10-40 MG capsule Take 1 capsule by mouth daily. 90 capsule 3 11/20/2019 at 0700  . aspirin EC 81 MG tablet Take 81 mg by mouth daily.   11/19/2019 at 0800  . carvedilol (COREG) 25 MG tablet Take 1 tablet (25 mg total) by mouth 2 (two) times daily with a meal. 180 tablet 3 11/20/2019 at 0700  . hydrochlorothiazide (HYDRODIURIL) 25 MG tablet Take 1 tablet (25 mg total) by mouth daily. 90 tablet 3 11/20/2019 at 0700  . pravastatin (PRAVACHOL) 40 MG tablet Take 40 mg by mouth daily.   11/19/2019 at 1800     No Known Allergies   Past Medical History:  Diagnosis Date  . Cardiomegaly   . Essential hypertension   . Impaired glucose tolerance   . Unspecified hearing loss, bilateral     Review of systems:  Otherwise negative.    Physical Exam  Gen: Alert, oriented. Appears stated age.  HEENT: [place]/AT. PERRLA. Lungs: CTA, no wheezes. CV: RR nl S1, S2. Abd: soft, benign, no masses. BS+ Ext: No edema. Pulses 2+    Planned procedures: Proceed with colonoscopy. The patient understands the nature of the planned procedure, indications, risks, alternatives and potential complications including but not limited to bleeding, infection, perforation, damage to internal organs and possible oversedation/side effects from anesthesia.  The patient agrees and gives consent to proceed.  Please refer to procedure notes for findings, recommendations and patient disposition/instructions.     Kathlyne Loud K. [name], M.D. Gastroenterology 11/20/2019  11:13 AM

## 2019-11-20 NOTE — Anesthesia Preprocedure Evaluation (Signed)
Anesthesia Evaluation  Patient identified by MRN, date of birth, ID band Patient awake    Reviewed: Allergy & Precautions, H&P , NPO status , Patient's Chart, lab work & pertinent test results, reviewed documented beta blocker date and time   Airway Mallampati: II   Neck ROM: full    Dental  (+) Poor Dentition   Pulmonary neg pulmonary ROS, former smoker,    Pulmonary exam normal        Cardiovascular Exercise Tolerance: Good hypertension, On Medications negative cardio ROS Normal cardiovascular exam Rhythm:regular Rate:Normal     Neuro/Psych negative neurological ROS  negative psych ROS   GI/Hepatic negative GI ROS, Neg liver ROS,   Endo/Other  Morbid obesity  Renal/GU negative Renal ROS  negative genitourinary   Musculoskeletal   Abdominal   Peds  Hematology negative hematology ROS (+)   Anesthesia Other Findings Past Medical History: No date: Cardiomegaly No date: Essential hypertension No date: Impaired glucose tolerance No date: Unspecified hearing loss, bilateral Past Surgical History: No date: COLONOSCOPY BMI    Body Mass Index: 39.94 kg/m     Reproductive/Obstetrics negative OB ROS                             Anesthesia Physical Anesthesia Plan  ASA: III  Anesthesia Plan: General   Post-op Pain Management:    Induction:   PONV Risk Score and Plan:   Airway Management Planned:   Additional Equipment:   Intra-op Plan:   Post-operative Plan:   Informed Consent: I have reviewed the patients History and Physical, chart, labs and discussed the procedure including the risks, benefits and alternatives for the proposed anesthesia with the patient or authorized representative who has indicated his/her understanding and acceptance.     Dental Advisory Given  Plan Discussed with: CRNA  Anesthesia Plan Comments:         Anesthesia Quick Evaluation

## 2019-11-21 ENCOUNTER — Encounter: Payer: Self-pay | Admitting: *Deleted

## 2019-11-21 LAB — SURGICAL PATHOLOGY

## 2019-12-08 DIAGNOSIS — M25571 Pain in right ankle and joints of right foot: Secondary | ICD-10-CM | POA: Diagnosis not present

## 2019-12-16 DIAGNOSIS — M19071 Primary osteoarthritis, right ankle and foot: Secondary | ICD-10-CM | POA: Diagnosis not present

## 2020-01-20 DIAGNOSIS — I10 Essential (primary) hypertension: Secondary | ICD-10-CM | POA: Diagnosis not present

## 2020-01-20 DIAGNOSIS — Z125 Encounter for screening for malignant neoplasm of prostate: Secondary | ICD-10-CM | POA: Diagnosis not present

## 2020-01-20 DIAGNOSIS — E785 Hyperlipidemia, unspecified: Secondary | ICD-10-CM | POA: Diagnosis not present

## 2020-01-20 DIAGNOSIS — R739 Hyperglycemia, unspecified: Secondary | ICD-10-CM | POA: Diagnosis not present

## 2020-01-27 DIAGNOSIS — E119 Type 2 diabetes mellitus without complications: Secondary | ICD-10-CM | POA: Diagnosis not present

## 2020-01-27 DIAGNOSIS — Z0000 Encounter for general adult medical examination without abnormal findings: Secondary | ICD-10-CM | POA: Diagnosis not present

## 2020-01-27 DIAGNOSIS — I10 Essential (primary) hypertension: Secondary | ICD-10-CM | POA: Diagnosis not present

## 2020-01-27 DIAGNOSIS — E782 Mixed hyperlipidemia: Secondary | ICD-10-CM | POA: Diagnosis not present

## 2020-01-27 DIAGNOSIS — F4321 Adjustment disorder with depressed mood: Secondary | ICD-10-CM | POA: Diagnosis not present

## 2020-03-12 DIAGNOSIS — R0602 Shortness of breath: Secondary | ICD-10-CM | POA: Diagnosis not present

## 2020-03-12 DIAGNOSIS — E782 Mixed hyperlipidemia: Secondary | ICD-10-CM | POA: Diagnosis not present

## 2020-03-12 DIAGNOSIS — I119 Hypertensive heart disease without heart failure: Secondary | ICD-10-CM | POA: Diagnosis not present

## 2020-03-12 DIAGNOSIS — I10 Essential (primary) hypertension: Secondary | ICD-10-CM | POA: Diagnosis not present

## 2020-04-19 DIAGNOSIS — E782 Mixed hyperlipidemia: Secondary | ICD-10-CM | POA: Diagnosis not present

## 2020-04-19 DIAGNOSIS — E119 Type 2 diabetes mellitus without complications: Secondary | ICD-10-CM | POA: Diagnosis not present

## 2020-05-04 DIAGNOSIS — Z125 Encounter for screening for malignant neoplasm of prostate: Secondary | ICD-10-CM | POA: Diagnosis not present

## 2020-05-04 DIAGNOSIS — E782 Mixed hyperlipidemia: Secondary | ICD-10-CM | POA: Diagnosis not present

## 2020-05-04 DIAGNOSIS — I10 Essential (primary) hypertension: Secondary | ICD-10-CM | POA: Diagnosis not present

## 2020-05-04 DIAGNOSIS — E119 Type 2 diabetes mellitus without complications: Secondary | ICD-10-CM | POA: Diagnosis not present

## 2020-05-19 DIAGNOSIS — J208 Acute bronchitis due to other specified organisms: Secondary | ICD-10-CM | POA: Diagnosis not present

## 2020-05-19 DIAGNOSIS — R21 Rash and other nonspecific skin eruption: Secondary | ICD-10-CM | POA: Diagnosis not present

## 2020-08-13 ENCOUNTER — Encounter: Payer: Self-pay | Admitting: Cardiovascular Disease

## 2020-09-14 DIAGNOSIS — Z23 Encounter for immunization: Secondary | ICD-10-CM | POA: Diagnosis not present

## 2020-09-14 DIAGNOSIS — I119 Hypertensive heart disease without heart failure: Secondary | ICD-10-CM | POA: Diagnosis not present

## 2020-09-14 DIAGNOSIS — R0602 Shortness of breath: Secondary | ICD-10-CM | POA: Diagnosis not present

## 2020-09-14 DIAGNOSIS — E782 Mixed hyperlipidemia: Secondary | ICD-10-CM | POA: Diagnosis not present

## 2020-09-14 DIAGNOSIS — I10 Essential (primary) hypertension: Secondary | ICD-10-CM | POA: Diagnosis not present

## 2020-10-27 DIAGNOSIS — I10 Essential (primary) hypertension: Secondary | ICD-10-CM | POA: Diagnosis not present

## 2020-10-27 DIAGNOSIS — E119 Type 2 diabetes mellitus without complications: Secondary | ICD-10-CM | POA: Diagnosis not present

## 2020-10-27 DIAGNOSIS — E782 Mixed hyperlipidemia: Secondary | ICD-10-CM | POA: Diagnosis not present

## 2020-10-27 DIAGNOSIS — Z125 Encounter for screening for malignant neoplasm of prostate: Secondary | ICD-10-CM | POA: Diagnosis not present

## 2020-11-15 DIAGNOSIS — E782 Mixed hyperlipidemia: Secondary | ICD-10-CM | POA: Diagnosis not present

## 2020-11-15 DIAGNOSIS — E119 Type 2 diabetes mellitus without complications: Secondary | ICD-10-CM | POA: Diagnosis not present

## 2020-11-15 DIAGNOSIS — M25571 Pain in right ankle and joints of right foot: Secondary | ICD-10-CM | POA: Diagnosis not present

## 2020-11-15 DIAGNOSIS — I10 Essential (primary) hypertension: Secondary | ICD-10-CM | POA: Diagnosis not present

## 2020-12-27 DIAGNOSIS — H5203 Hypermetropia, bilateral: Secondary | ICD-10-CM | POA: Diagnosis not present

## 2020-12-27 DIAGNOSIS — H52213 Irregular astigmatism, bilateral: Secondary | ICD-10-CM | POA: Diagnosis not present

## 2020-12-27 DIAGNOSIS — H25043 Posterior subcapsular polar age-related cataract, bilateral: Secondary | ICD-10-CM | POA: Diagnosis not present

## 2020-12-27 DIAGNOSIS — H524 Presbyopia: Secondary | ICD-10-CM | POA: Diagnosis not present

## 2021-03-14 DIAGNOSIS — I517 Cardiomegaly: Secondary | ICD-10-CM | POA: Diagnosis not present

## 2021-03-14 DIAGNOSIS — R9431 Abnormal electrocardiogram [ECG] [EKG]: Secondary | ICD-10-CM | POA: Diagnosis not present

## 2021-03-14 DIAGNOSIS — R0602 Shortness of breath: Secondary | ICD-10-CM | POA: Diagnosis not present

## 2021-03-14 DIAGNOSIS — I119 Hypertensive heart disease without heart failure: Secondary | ICD-10-CM | POA: Diagnosis not present

## 2021-03-14 DIAGNOSIS — E782 Mixed hyperlipidemia: Secondary | ICD-10-CM | POA: Diagnosis not present

## 2021-03-14 DIAGNOSIS — I10 Essential (primary) hypertension: Secondary | ICD-10-CM | POA: Diagnosis not present

## 2021-11-10 DIAGNOSIS — Z131 Encounter for screening for diabetes mellitus: Secondary | ICD-10-CM | POA: Diagnosis not present

## 2021-12-04 DIAGNOSIS — E119 Type 2 diabetes mellitus without complications: Secondary | ICD-10-CM

## 2021-12-04 HISTORY — DX: Type 2 diabetes mellitus without complications: E11.9

## 2022-01-31 DIAGNOSIS — I119 Hypertensive heart disease without heart failure: Secondary | ICD-10-CM | POA: Diagnosis not present

## 2022-01-31 DIAGNOSIS — E119 Type 2 diabetes mellitus without complications: Secondary | ICD-10-CM | POA: Diagnosis not present

## 2022-01-31 DIAGNOSIS — M19071 Primary osteoarthritis, right ankle and foot: Secondary | ICD-10-CM | POA: Diagnosis not present

## 2022-01-31 DIAGNOSIS — M25571 Pain in right ankle and joints of right foot: Secondary | ICD-10-CM | POA: Diagnosis not present

## 2022-02-08 DIAGNOSIS — S99811A Other specified injuries of right ankle, initial encounter: Secondary | ICD-10-CM | POA: Diagnosis not present

## 2022-02-08 DIAGNOSIS — M85671 Other cyst of bone, right ankle and foot: Secondary | ICD-10-CM | POA: Diagnosis not present

## 2022-02-08 DIAGNOSIS — M722 Plantar fascial fibromatosis: Secondary | ICD-10-CM | POA: Diagnosis not present

## 2022-02-08 DIAGNOSIS — S93491A Sprain of other ligament of right ankle, initial encounter: Secondary | ICD-10-CM | POA: Diagnosis not present

## 2022-02-08 DIAGNOSIS — M67961 Unspecified disorder of synovium and tendon, right lower leg: Secondary | ICD-10-CM | POA: Diagnosis not present

## 2022-02-08 DIAGNOSIS — S93401A Sprain of unspecified ligament of right ankle, initial encounter: Secondary | ICD-10-CM | POA: Diagnosis not present

## 2022-02-08 DIAGNOSIS — M19071 Primary osteoarthritis, right ankle and foot: Secondary | ICD-10-CM | POA: Diagnosis not present

## 2022-02-09 DIAGNOSIS — E119 Type 2 diabetes mellitus without complications: Secondary | ICD-10-CM | POA: Diagnosis not present

## 2022-02-09 DIAGNOSIS — I10 Essential (primary) hypertension: Secondary | ICD-10-CM | POA: Diagnosis not present

## 2022-02-09 DIAGNOSIS — E782 Mixed hyperlipidemia: Secondary | ICD-10-CM | POA: Diagnosis not present

## 2022-02-09 DIAGNOSIS — F32A Depression, unspecified: Secondary | ICD-10-CM | POA: Diagnosis not present

## 2022-02-09 DIAGNOSIS — H905 Unspecified sensorineural hearing loss: Secondary | ICD-10-CM | POA: Diagnosis not present

## 2022-02-09 DIAGNOSIS — Z0000 Encounter for general adult medical examination without abnormal findings: Secondary | ICD-10-CM | POA: Diagnosis not present

## 2022-02-09 DIAGNOSIS — R350 Frequency of micturition: Secondary | ICD-10-CM | POA: Diagnosis not present

## 2022-02-09 DIAGNOSIS — M25571 Pain in right ankle and joints of right foot: Secondary | ICD-10-CM | POA: Diagnosis not present

## 2022-02-09 DIAGNOSIS — Z125 Encounter for screening for malignant neoplasm of prostate: Secondary | ICD-10-CM | POA: Diagnosis not present

## 2022-02-20 DIAGNOSIS — M958 Other specified acquired deformities of musculoskeletal system: Secondary | ICD-10-CM | POA: Diagnosis not present

## 2022-02-20 DIAGNOSIS — M19071 Primary osteoarthritis, right ankle and foot: Secondary | ICD-10-CM | POA: Diagnosis not present

## 2022-03-02 DIAGNOSIS — E119 Type 2 diabetes mellitus without complications: Secondary | ICD-10-CM | POA: Diagnosis not present

## 2022-03-02 DIAGNOSIS — H5203 Hypermetropia, bilateral: Secondary | ICD-10-CM | POA: Diagnosis not present

## 2022-03-02 DIAGNOSIS — H52213 Irregular astigmatism, bilateral: Secondary | ICD-10-CM | POA: Diagnosis not present

## 2022-03-02 DIAGNOSIS — H25043 Posterior subcapsular polar age-related cataract, bilateral: Secondary | ICD-10-CM | POA: Diagnosis not present

## 2022-03-02 DIAGNOSIS — H524 Presbyopia: Secondary | ICD-10-CM | POA: Diagnosis not present

## 2022-03-21 DIAGNOSIS — I10 Essential (primary) hypertension: Secondary | ICD-10-CM | POA: Diagnosis not present

## 2022-03-21 DIAGNOSIS — I119 Hypertensive heart disease without heart failure: Secondary | ICD-10-CM | POA: Diagnosis not present

## 2022-03-21 DIAGNOSIS — E782 Mixed hyperlipidemia: Secondary | ICD-10-CM | POA: Diagnosis not present

## 2022-03-21 DIAGNOSIS — R0602 Shortness of breath: Secondary | ICD-10-CM | POA: Diagnosis not present

## 2022-03-21 DIAGNOSIS — I517 Cardiomegaly: Secondary | ICD-10-CM | POA: Diagnosis not present

## 2022-03-22 DIAGNOSIS — H903 Sensorineural hearing loss, bilateral: Secondary | ICD-10-CM | POA: Diagnosis not present

## 2022-03-22 DIAGNOSIS — H90A22 Sensorineural hearing loss, unilateral, left ear, with restricted hearing on the contralateral side: Secondary | ICD-10-CM | POA: Diagnosis not present

## 2022-03-23 ENCOUNTER — Other Ambulatory Visit: Payer: Self-pay | Admitting: Otolaryngology

## 2022-03-23 DIAGNOSIS — H903 Sensorineural hearing loss, bilateral: Secondary | ICD-10-CM

## 2022-03-30 DIAGNOSIS — I119 Hypertensive heart disease without heart failure: Secondary | ICD-10-CM | POA: Diagnosis not present

## 2022-03-30 DIAGNOSIS — I517 Cardiomegaly: Secondary | ICD-10-CM | POA: Diagnosis not present

## 2022-03-30 DIAGNOSIS — R0602 Shortness of breath: Secondary | ICD-10-CM | POA: Diagnosis not present

## 2022-04-04 ENCOUNTER — Ambulatory Visit
Admission: RE | Admit: 2022-04-04 | Discharge: 2022-04-04 | Disposition: A | Discharge status: Home / Self Care | Payer: PPO | Source: Ambulatory Visit | Attending: Otolaryngology | Admitting: Otolaryngology

## 2022-04-04 DIAGNOSIS — H903 Sensorineural hearing loss, bilateral: Secondary | ICD-10-CM

## 2022-04-04 DIAGNOSIS — G319 Degenerative disease of nervous system, unspecified: Secondary | ICD-10-CM | POA: Diagnosis not present

## 2022-04-04 DIAGNOSIS — H905 Unspecified sensorineural hearing loss: Secondary | ICD-10-CM | POA: Diagnosis not present

## 2022-04-04 MED ORDER — GADOBENATE DIMEGLUMINE 529 MG/ML IV SOLN
20.0000 mL | Freq: Once | INTRAVENOUS | Status: AC | PRN
  Administered 2022-04-04: 20 mL via INTRAVENOUS

## 2022-04-18 ENCOUNTER — Emergency Department: Payer: PPO

## 2022-04-18 ENCOUNTER — Emergency Department
Admission: EM | Admit: 2022-04-18 | Discharge: 2022-04-18 | Disposition: A | Discharge status: Home / Self Care | Payer: PPO | Attending: Emergency Medicine | Admitting: Emergency Medicine

## 2022-04-18 ENCOUNTER — Other Ambulatory Visit: Payer: Self-pay

## 2022-04-18 DIAGNOSIS — K8689 Other specified diseases of pancreas: Secondary | ICD-10-CM | POA: Diagnosis not present

## 2022-04-18 DIAGNOSIS — R0781 Pleurodynia: Secondary | ICD-10-CM | POA: Diagnosis not present

## 2022-04-18 DIAGNOSIS — I880 Nonspecific mesenteric lymphadenitis: Secondary | ICD-10-CM | POA: Diagnosis not present

## 2022-04-18 DIAGNOSIS — N400 Enlarged prostate without lower urinary tract symptoms: Secondary | ICD-10-CM | POA: Diagnosis not present

## 2022-04-18 DIAGNOSIS — I700 Atherosclerosis of aorta: Secondary | ICD-10-CM | POA: Diagnosis not present

## 2022-04-18 DIAGNOSIS — R160 Hepatomegaly, not elsewhere classified: Secondary | ICD-10-CM | POA: Diagnosis not present

## 2022-04-18 DIAGNOSIS — K8020 Calculus of gallbladder without cholecystitis without obstruction: Secondary | ICD-10-CM | POA: Diagnosis not present

## 2022-04-18 DIAGNOSIS — R590 Localized enlarged lymph nodes: Secondary | ICD-10-CM | POA: Diagnosis not present

## 2022-04-18 DIAGNOSIS — K760 Fatty (change of) liver, not elsewhere classified: Secondary | ICD-10-CM | POA: Diagnosis not present

## 2022-04-18 DIAGNOSIS — Z7984 Long term (current) use of oral hypoglycemic drugs: Secondary | ICD-10-CM | POA: Insufficient documentation

## 2022-04-18 DIAGNOSIS — R109 Unspecified abdominal pain: Secondary | ICD-10-CM

## 2022-04-18 DIAGNOSIS — E119 Type 2 diabetes mellitus without complications: Secondary | ICD-10-CM | POA: Diagnosis not present

## 2022-04-18 DIAGNOSIS — K5900 Constipation, unspecified: Secondary | ICD-10-CM | POA: Insufficient documentation

## 2022-04-18 LAB — CBC WITH DIFFERENTIAL/PLATELET
Abs Immature Granulocytes: 0.01 10*3/uL (ref 0.00–0.07)
Basophils Absolute: 0 10*3/uL (ref 0.0–0.1)
Basophils Relative: 0 %
Eosinophils Absolute: 0.1 10*3/uL (ref 0.0–0.5)
Eosinophils Relative: 2 %
HCT: 44.8 % (ref 39.0–52.0)
Hemoglobin: 14.3 g/dL (ref 13.0–17.0)
Immature Granulocytes: 0 %
Lymphocytes Relative: 40 %
Lymphs Abs: 2.6 10*3/uL (ref 0.7–4.0)
MCH: 29.2 pg (ref 26.0–34.0)
MCHC: 31.9 g/dL (ref 30.0–36.0)
MCV: 91.6 fL (ref 80.0–100.0)
Monocytes Absolute: 0.4 10*3/uL (ref 0.1–1.0)
Monocytes Relative: 6 %
Neutro Abs: 3.4 10*3/uL (ref 1.7–7.7)
Neutrophils Relative %: 52 %
Platelets: 199 10*3/uL (ref 150–400)
RBC: 4.89 MIL/uL (ref 4.22–5.81)
RDW: 12.7 % (ref 11.5–15.5)
WBC: 6.5 10*3/uL (ref 4.0–10.5)
nRBC: 0 % (ref 0.0–0.2)

## 2022-04-18 LAB — COMPREHENSIVE METABOLIC PANEL
ALT: 30 U/L (ref 0–44)
AST: 26 U/L (ref 15–41)
Albumin: 4.2 g/dL (ref 3.5–5.0)
Alkaline Phosphatase: 51 U/L (ref 38–126)
Anion gap: 8 (ref 5–15)
BUN: 24 mg/dL — ABNORMAL HIGH (ref 8–23)
CO2: 28 mmol/L (ref 22–32)
Calcium: 9.4 mg/dL (ref 8.9–10.3)
Chloride: 104 mmol/L (ref 98–111)
Creatinine, Ser: 0.96 mg/dL (ref 0.61–1.24)
GFR, Estimated: >60 mL/min (ref >60)
Glucose, Bld: 146 mg/dL — ABNORMAL HIGH (ref 70–99)
Potassium: 3.3 mmol/L — ABNORMAL LOW (ref 3.5–5.1)
Sodium: 140 mmol/L (ref 135–145)
Total Bilirubin: 0.9 mg/dL (ref 0.3–1.2)
Total Protein: 7.4 g/dL (ref 6.5–8.1)

## 2022-04-18 LAB — URINALYSIS, ROUTINE W REFLEX MICROSCOPIC
Bilirubin Urine: NEGATIVE
Glucose, UA: NEGATIVE mg/dL
Hgb urine dipstick: NEGATIVE
Ketones, ur: NEGATIVE mg/dL
Leukocytes,Ua: NEGATIVE
Nitrite: NEGATIVE
Protein, ur: NEGATIVE mg/dL
Specific Gravity, Urine: 1.019 (ref 1.005–1.030)
pH: 6 (ref 5.0–8.0)

## 2022-04-18 LAB — LIPASE, BLOOD: Lipase: 50 U/L (ref 11–51)

## 2022-04-18 MED ORDER — AMOXICILLIN-POT CLAVULANATE 875-125 MG PO TABS: 1.0000 | ORAL_TABLET | Freq: Two times a day (BID) | ORAL | 0 refills | Status: AC

## 2022-04-18 MED ORDER — MORPHINE SULFATE (PF) 4 MG/ML IV SOLN
4.0000 mg | Freq: Once | INTRAVENOUS | Status: DC
  Filled 2022-04-18: qty 1

## 2022-04-18 MED ORDER — POLYETHYLENE GLYCOL 3350 17 GM/SCOOP PO POWD: 1.0000 | Freq: Once | ORAL | 0 refills | Status: AC

## 2022-04-18 MED ORDER — ONDANSETRON HCL 4 MG/2ML IJ SOLN
4.0000 mg | Freq: Once | INTRAMUSCULAR | Status: DC
  Filled 2022-04-18: qty 2

## 2022-04-18 MED ORDER — IOHEXOL 300 MG/ML  SOLN
100.0000 mL | Freq: Once | INTRAMUSCULAR | Status: AC | PRN
  Administered 2022-04-18: 100 mL via INTRAVENOUS

## 2022-04-18 MED ORDER — GADOBUTROL 1 MMOL/ML IV SOLN
10.0000 mL | Freq: Once | INTRAVENOUS | Status: AC | PRN
  Administered 2022-04-18: 10 mL via INTRAVENOUS

## 2022-04-18 NOTE — Discharge Instructions (Addendum)
Take one 17 g scoop of MiraLAX twice a day for a week or until you get a series of soft stools  ? ?Please follow-up with your primary care doctor and doctor in has a GI doctor.  They want you to have a repeat MRI with contrast in 3 to 6 months. ? ?The MRI showed the mass in the liver.  It looks more like a hemangioma than anything else but they are not 100% sure.  They want the second MRI to look better and to see if it changes. ? ?The larger lymph nodes in your belly may be due to an infection.  This could be called mesenteric adenitis.  I will give you some Augmentin antibiotic 1 pill twice a day with food to try to see if this will go away. ? ?You can try Motrin 3 of the over-the-counter pills 3 times a day for 2 or 3 days if the pain you are having becomes bad.  If he gets very questions come back. ? ?Please return for fever chills nausea or vomiting or diarrhea or worse pain. ?

## 2022-04-18 NOTE — ED Provider Notes (Signed)
? ?[facility] ?Provider Note ? ? ? Event Date/Time  ? First MD Initiated Contact with Patient 04/18/22 [phone]   ?  (approximate) ? ? ?History  ? ?Flank Pain ? ? ?HPI ? ?Daryl Gonzalez is a 66 y.o. male who reports left-sided pain in the area of the left mid axillary to posterior axillary line.  It is worse with walking and when he gets the bumps in the road.  He is really not wanting to eat much.  He has no stool changes no nausea or vomiting.  No diarrhea.  He was recently diagnosed with diabetes and started on metformin a few months back.  He had some diarrhea initially with that but that is since stopped.  His hemoglobin A1c was 9.  There is no pain in the soft tissue of the scanner.  The pain in the rib.  There is no pain with deep breathing.  No fever or coughing or shortness of breath. ? ?  ? ? ?Physical Exam  ? ?Triage Vital Signs: ?ED Triage Vitals [04/18/22 0934]  ?Enc Vitals Group  ?   BP 133/70  ?   Pulse Rate (!) 58  ?   Resp 18  ?   Temp 97.6 ?F (36.4 ?C)  ?   Temp Source Oral  ?   SpO2 97 %  ?   Weight 245 lb (111.1 kg)  ?   Height '5\' 8"'$  (1.727 m)  ?   Head Circumference   ?   Peak Flow   ?   Pain Score 8  ?   Pain Loc   ?   Pain Edu?   ?   Excl. in [place]?   ? ? ?Most recent vital signs: ?Vitals:  ? 04/18/22 0934 04/18/22 1324  ?BP: 133/70 129/67  ?Pulse: (!) 58 60  ?Resp: 18 17  ?Temp: 97.6 ?F (36.4 ?C) 97.8 ?F (36.6 ?C)  ?SpO2: 97% 98%  ? ? ?General: Awake, no distress.  ?CV:  Good peripheral perfusion.  Regular rate and rhythm no murmurs ?Resp:  Normal effort.  Lungs are clear ?Abd:  No distention.  Soft nontender except for some tenderness on the left side in the upper abdomen no organomegaly no CVAT and ? ? ? ?ED Results / Procedures / Treatments  ? ?Labs ?(all labs ordered are listed, but only abnormal results are displayed) ?Labs Reviewed  ?COMPREHENSIVE METABOLIC PANEL - Abnormal; Notable for the following components:  ?    Result Value  ? Potassium 3.3 (*)   ? Glucose, Bld  146 (*)   ? BUN 24 (*)   ? All other components within normal limits  ?URINALYSIS, ROUTINE W REFLEX MICROSCOPIC - Abnormal; Notable for the following components:  ? Color, Urine YELLOW (*)   ? APPearance CLEAR (*)   ? All other components within normal limits  ?LIPASE, BLOOD  ?CBC WITH DIFFERENTIAL/PLATELET  ? ? ? ?EKG ? ? ? ? ?RADIOLOGY ?CT of the abdomen read by radiology reviewed and interpreted by me as the films were shows good bit of stool in the colon some enlarged lymph glands and an indeterminate mass in the liver ? ?MRI of the abdomen read by radiology and films reviewed and interpreted by me provide a better view of the mass.  Radiology still is not sure what it is overly thickened denies any angioedema.  Course of stool for also there. ? ? ?PROCEDURES: ? ?Critical Care performed:  ? ?Procedures ? ? ?MEDICATIONS ORDERED IN  ED: ?Medications  ?morphine (PF) 4 MG/ML injection 4 mg (0 mg Intravenous Hold 04/18/22 1036)  ?ondansetron [facility]) injection 4 mg (0 mg Intravenous Hold 04/18/22 1037)  ?iohexol (OMNIPAQUE) 300 MG/ML solution 100 mL (100 mLs Intravenous Contrast Given 04/18/22 1052)  ?gadobutrol (GADAVIST) 1 MMOL/ML injection 10 mL (10 mLs Intravenous Contrast Given 04/18/22 1246)  ? ? ? ?IMPRESSION / MDM / ASSESSMENT AND PLAN / ED COURSE  ?I reviewed the triage vital signs and the nursing notes. ?Patient with abdominal pain.  There is no sign of any cholecystitis although there is some sludge and stones in the gallbladder.  The mass is more to the right than the pain.  Patient Has Some Pancreatic Calcifications on CT but Nothing That.  Acute Would Be Causing Pain.  Pain May Be Due To Reviewing the Enlarged Lymph Nodes Which I Am Calling Mesenteric Adenitis or 2 Constipation.  This Seems to Come and Go Which Could Be Caused by the Constipation.  I Will Have the Patient Try Some MiraLAX to Regulate That Same Antibiotics for the Possible Mesenteric Adenitis and a Course of Follow-Up for the Liver.  I  Discussed All of This Very Carefully with Him and His Daughter. ?The liver mass could be a cancer although more likely according to radiologist means you are something similar.  I have told the patient and the family that. ? ? ? ? ?FINAL CLINICAL IMPRESSION(S) / ED DIAGNOSES  ? ?Final diagnoses:  ?Flank pain  ?Mesenteric adenitis  ?Constipation, unspecified constipation type  ? ? ? ?Rx / DC Orders  ? ?ED Discharge Orders   ? ?      Ordered  ?  polyethylene glycol powder (MIRALAX) 17 GM/SCOOP powder   Once       ? 04/18/22 1340  ?  amoxicillin-clavulanate (AUGMENTIN) 875-125 MG tablet  2 times daily       ? 04/18/22 1347  ? ?  ?  ? ?  ? ? ? ?Note:  This document was prepared using Dragon voice recognition software and may include unintentional dictation errors. ?  ?Nena Polio, MD ?04/18/22 1430 ? ?

## 2022-04-18 NOTE — ED Triage Notes (Signed)
Pt here with left sided flank pain that started 2 weeks ago. Pt states pain has become more consistent. Pt denies N/V/D. Pt was recently diagnosed with DM and started on Metformin.  ?

## 2022-04-18 NOTE — ED Notes (Signed)
Pt transported to MRI 

## 2022-04-20 DIAGNOSIS — I119 Hypertensive heart disease without heart failure: Secondary | ICD-10-CM | POA: Diagnosis not present

## 2022-04-20 DIAGNOSIS — E782 Mixed hyperlipidemia: Secondary | ICD-10-CM | POA: Diagnosis not present

## 2022-04-20 DIAGNOSIS — I10 Essential (primary) hypertension: Secondary | ICD-10-CM | POA: Diagnosis not present

## 2022-04-20 DIAGNOSIS — H903 Sensorineural hearing loss, bilateral: Secondary | ICD-10-CM | POA: Diagnosis not present

## 2022-04-20 DIAGNOSIS — I517 Cardiomegaly: Secondary | ICD-10-CM | POA: Diagnosis not present

## 2022-04-27 DIAGNOSIS — E119 Type 2 diabetes mellitus without complications: Secondary | ICD-10-CM | POA: Diagnosis not present

## 2022-05-03 DIAGNOSIS — M958 Other specified acquired deformities of musculoskeletal system: Secondary | ICD-10-CM | POA: Diagnosis not present

## 2022-05-03 DIAGNOSIS — E119 Type 2 diabetes mellitus without complications: Secondary | ICD-10-CM | POA: Diagnosis not present

## 2022-05-03 DIAGNOSIS — M19071 Primary osteoarthritis, right ankle and foot: Secondary | ICD-10-CM | POA: Diagnosis not present

## 2022-05-03 DIAGNOSIS — M25571 Pain in right ankle and joints of right foot: Secondary | ICD-10-CM | POA: Diagnosis not present

## 2022-05-04 DIAGNOSIS — E119 Type 2 diabetes mellitus without complications: Secondary | ICD-10-CM | POA: Diagnosis not present

## 2022-05-04 DIAGNOSIS — K769 Liver disease, unspecified: Secondary | ICD-10-CM | POA: Diagnosis not present

## 2022-05-04 DIAGNOSIS — E669 Obesity, unspecified: Secondary | ICD-10-CM | POA: Diagnosis not present

## 2022-05-04 DIAGNOSIS — R109 Unspecified abdominal pain: Secondary | ICD-10-CM | POA: Diagnosis not present

## 2022-05-09 ENCOUNTER — Other Ambulatory Visit: Payer: Self-pay | Admitting: Podiatry

## 2022-05-09 DIAGNOSIS — M958 Other specified acquired deformities of musculoskeletal system: Secondary | ICD-10-CM | POA: Diagnosis not present

## 2022-05-09 DIAGNOSIS — E119 Type 2 diabetes mellitus without complications: Secondary | ICD-10-CM | POA: Diagnosis not present

## 2022-05-09 DIAGNOSIS — M19071 Primary osteoarthritis, right ankle and foot: Secondary | ICD-10-CM | POA: Diagnosis not present

## 2022-05-09 DIAGNOSIS — I119 Hypertensive heart disease without heart failure: Secondary | ICD-10-CM | POA: Diagnosis not present

## 2022-05-17 ENCOUNTER — Encounter
Admission: RE | Admit: 2022-05-17 | Discharge: 2022-05-17 | Disposition: A | Discharge status: Home / Self Care | Payer: PPO | Source: Ambulatory Visit | Attending: Podiatry | Admitting: Podiatry

## 2022-05-17 ENCOUNTER — Inpatient Hospital Stay: Admission: RE | Admit: 2022-05-17 | Payer: PPO | Source: Ambulatory Visit

## 2022-05-17 VITALS — Ht 67.0 in | Wt 235.0 lb

## 2022-05-17 DIAGNOSIS — I517 Cardiomegaly: Secondary | ICD-10-CM

## 2022-05-17 DIAGNOSIS — I10 Essential (primary) hypertension: Secondary | ICD-10-CM

## 2022-05-17 DIAGNOSIS — E119 Type 2 diabetes mellitus without complications: Secondary | ICD-10-CM

## 2022-05-17 HISTORY — DX: Hyperlipidemia, unspecified: E78.5

## 2022-05-17 HISTORY — DX: Cardiomegaly: I51.7

## 2022-05-17 HISTORY — DX: Dyspnea, unspecified: R06.00

## 2022-05-17 NOTE — Patient Instructions (Addendum)
Your procedure is scheduled on: Friday May 26, 2022. Report to Day Surgery inside [facility] 2nd floor, stop by admissions desk before getting on elevator. To find out your arrival time please call [phone] between 1PM - 3PM on Thursday May 25, 2022.  Remember: Instructions that are not followed completely may result in serious medical risk,  up to and including death, or upon the discretion of your surgeon and anesthesiologist your  surgery may need to be rescheduled.     _X__ 1. Do not eat food after midnight the night before your procedure.                 No chewing gum or hard candies. You may drink clear liquids up to 2 hours                 before you are scheduled to arrive for your surgery- DO not drink clear                 liquids within 2 hours of the start of your surgery.                 Clear Liquids include:  water, Black Coffee or Tea (Do not add                 anything to coffee or tea).  __X__3.  On the morning of surgery brush your teeth with toothpaste and water, you                may rinse your mouth with mouthwash if you wish.  Do not swallow any toothpaste or mouthwash.     _X__ 4.  No Alcohol for 24 hours before or after surgery.   _X__ 5.  Do Not Smoke or use e-cigarettes For 24 Hours Prior to Your Surgery.                 Do not use any chewable tobacco products for at least 6 hours prior to                 Surgery.  _X__  6.  Do not use any recreational drugs (marijuana, cocaine, heroin, ecstasy, MDMA or other)                For at least one week prior to your surgery.  Combination of these drugs with anesthesia                May have life threatening results.  ____  7.  Bring all medications with you on the day of surgery if instructed.   __X_ 8.  Notify your doctor if there is any change in your medical condition      (cold, fever, infections).     Do not wear jewelry, make-up, hairpins, clips or nail polish. Do not  wear lotions, powders, or perfumes. You may wear deodorant. Do not shave 48 hours prior to surgery. Men may shave face and neck. Do not bring valuables to the hospital.    [facility] is not responsible for any belongings or valuables.  Contacts, dentures or bridgework may not be worn into surgery. Leave your suitcase in the car. After surgery it may be brought to your room. For patients admitted to the hospital, discharge time is determined by your treatment team.   Patients discharged the day of surgery will not be allowed to drive home.   Make arrangements for someone to be with you for  the first 24 hours of your Same Day Discharge.   __X__ Take these medicines the morning of surgery with A SIP OF WATER:    1. amLODipine-benazepril (LOTREL) 10-40 MG capsule  2. carvedilol (COREG) 25 MG  3. pravastatin (PRAVACHOL) 40 MG tablet  4.  5.  6.  ____ Fleet Enema (as directed)   _X___ Use CHG Soap (or Antibacterial soap like Dial) shower the night before and the morning of surgery.  ____ Use Benzoyl Peroxide Gel as instructed  ____ Use inhalers on the day of surgery  __X__ Stop metformin 2 days prior to surgery (take last dose Tuesday June 20)    ____ Take 1/2 of usual insulin dose the night before surgery. No insulin the morning          of surgery.   ____ Call your PCP, cardiologist, or Pulmonologist if taking Coumadin/Plavix/aspirin and ask when to stop before your surgery.   __X__ One Week prior to surgery- Stop Anti-inflammatories such as Ibuprofen, Aleve, Advil, Motrin, meloxicam (MOBIC), diclofenac, etodolac, ketorolac, Toradol, Daypro, piroxicam, Aspirin,  Goody's or BC powders. OK TO USE TYLENOL IF NEEDED   __X__ One Week prior to surgery- stop vitamins and or supplements until after surgery.    ____ Bring C-Pap to the hospital.    If you have any questions regarding your pre-procedure instructions,  Please call Pre-admit Testing at [phone]

## 2022-05-18 ENCOUNTER — Other Ambulatory Visit: Payer: PPO

## 2022-05-25 MED ORDER — SODIUM CHLORIDE 0.9 % IV SOLN: INTRAVENOUS | Status: DC

## 2022-05-25 MED ORDER — FAMOTIDINE 20 MG PO TABS: 20.0000 mg | ORAL_TABLET | Freq: Once | ORAL | Status: AC

## 2022-05-25 MED ORDER — CEFAZOLIN SODIUM-DEXTROSE 2-4 GM/100ML-% IV SOLN
2.0000 g | INTRAVENOUS | Status: AC
  Administered 2022-05-26: 2 g via INTRAVENOUS

## 2022-05-25 MED ORDER — ORAL CARE MOUTH RINSE: 15.0000 mL | Freq: Once | OROMUCOSAL | Status: AC

## 2022-05-25 MED ORDER — CHLORHEXIDINE GLUCONATE 0.12 % MT SOLN: 15.0000 mL | Freq: Once | OROMUCOSAL | Status: AC

## 2022-05-26 ENCOUNTER — Ambulatory Visit: Payer: PPO

## 2022-05-26 ENCOUNTER — Ambulatory Visit: Payer: PPO | Admitting: Certified Registered"

## 2022-05-26 ENCOUNTER — Encounter
Admission: RE | Disposition: A | Discharge status: Home / Self Care | Payer: Self-pay | Source: Ambulatory Visit | Attending: Podiatry

## 2022-05-26 ENCOUNTER — Other Ambulatory Visit: Payer: Self-pay

## 2022-05-26 ENCOUNTER — Encounter: Payer: Self-pay | Admitting: Podiatry

## 2022-05-26 ENCOUNTER — Ambulatory Visit
Admission: RE | Admit: 2022-05-26 | Discharge: 2022-05-26 | Disposition: A | Discharge status: Home / Self Care | Payer: PPO | Source: Ambulatory Visit | Attending: Podiatry | Admitting: Podiatry

## 2022-05-26 DIAGNOSIS — E119 Type 2 diabetes mellitus without complications: Secondary | ICD-10-CM | POA: Insufficient documentation

## 2022-05-26 DIAGNOSIS — M25571 Pain in right ankle and joints of right foot: Secondary | ICD-10-CM | POA: Diagnosis not present

## 2022-05-26 DIAGNOSIS — M19071 Primary osteoarthritis, right ankle and foot: Secondary | ICD-10-CM | POA: Insufficient documentation

## 2022-05-26 DIAGNOSIS — Z87891 Personal history of nicotine dependence: Secondary | ICD-10-CM | POA: Diagnosis not present

## 2022-05-26 DIAGNOSIS — I517 Cardiomegaly: Secondary | ICD-10-CM

## 2022-05-26 DIAGNOSIS — E785 Hyperlipidemia, unspecified: Secondary | ICD-10-CM | POA: Diagnosis not present

## 2022-05-26 DIAGNOSIS — Z79899 Other long term (current) drug therapy: Secondary | ICD-10-CM | POA: Insufficient documentation

## 2022-05-26 DIAGNOSIS — Z981 Arthrodesis status: Secondary | ICD-10-CM | POA: Diagnosis not present

## 2022-05-26 DIAGNOSIS — I10 Essential (primary) hypertension: Secondary | ICD-10-CM

## 2022-05-26 HISTORY — PX: ANKLE FUSION: SHX881

## 2022-05-26 LAB — POCT I-STAT, CHEM 8
BUN: 20 mg/dL (ref 8–23)
Calcium, Ion: 1.09 mmol/L — ABNORMAL LOW (ref 1.15–1.40)
Chloride: 104 mmol/L (ref 98–111)
Creatinine, Ser: 1 mg/dL (ref 0.61–1.24)
Glucose, Bld: 117 mg/dL — ABNORMAL HIGH (ref 70–99)
HCT: 42 % (ref 39.0–52.0)
Hemoglobin: 14.3 g/dL (ref 13.0–17.0)
Potassium: 4 mmol/L (ref 3.5–5.1)
Sodium: 142 mmol/L (ref 135–145)
TCO2: 25 mmol/L (ref 22–32)

## 2022-05-26 LAB — GLUCOSE, CAPILLARY: Glucose-Capillary: 161 mg/dL — ABNORMAL HIGH (ref 70–99)

## 2022-05-26 SURGERY — ARTHRODESIS ANKLE
Anesthesia: General | Site: Ankle | Laterality: Right

## 2022-05-26 MED ORDER — PROPOFOL 10 MG/ML IV BOLUS
INTRAVENOUS | Status: AC
  Filled 2022-05-26: qty 20

## 2022-05-26 MED ORDER — EPHEDRINE SULFATE (PRESSORS) 50 MG/ML IJ SOLN
INTRAMUSCULAR | Status: DC | PRN
  Administered 2022-05-26 (×2): 10 mg via INTRAVENOUS
  Administered 2022-05-26: 5 mg via INTRAVENOUS

## 2022-05-26 MED ORDER — LIDOCAINE HCL (CARDIAC) PF 100 MG/5ML IV SOSY
PREFILLED_SYRINGE | INTRAVENOUS | Status: DC | PRN
  Administered 2022-05-26: 80 mg via INTRAVENOUS

## 2022-05-26 MED ORDER — PHENYLEPHRINE HCL-NACL 20-0.9 MG/250ML-% IV SOLN
INTRAVENOUS | Status: AC
  Filled 2022-05-26: qty 250

## 2022-05-26 MED ORDER — ROCURONIUM BROMIDE 10 MG/ML (PF) SYRINGE
PREFILLED_SYRINGE | INTRAVENOUS | Status: AC
  Filled 2022-05-26: qty 10

## 2022-05-26 MED ORDER — ACETAMINOPHEN 10 MG/ML IV SOLN
INTRAVENOUS | Status: DC | PRN
  Administered 2022-05-26: 1000 mg via INTRAVENOUS

## 2022-05-26 MED ORDER — ACETAMINOPHEN 10 MG/ML IV SOLN
INTRAVENOUS | Status: AC
  Filled 2022-05-26: qty 100

## 2022-05-26 MED ORDER — BUPIVACAINE HCL (PF) 0.5 % IJ SOLN
INTRAMUSCULAR | Status: AC
  Filled 2022-05-26: qty 30

## 2022-05-26 MED ORDER — ONDANSETRON HCL 4 MG/2ML IJ SOLN
INTRAMUSCULAR | Status: AC
  Filled 2022-05-26: qty 2

## 2022-05-26 MED ORDER — BUPIVACAINE-EPINEPHRINE (PF) 0.5% -1:200000 IJ SOLN
INTRAMUSCULAR | Status: AC
  Filled 2022-05-26: qty 30

## 2022-05-26 MED ORDER — ROPIVACAINE HCL 5 MG/ML IJ SOLN
INTRAMUSCULAR | Status: AC
  Filled 2022-05-26: qty 60

## 2022-05-26 MED ORDER — DEXAMETHASONE SODIUM PHOSPHATE 10 MG/ML IJ SOLN
INTRAMUSCULAR | Status: DC | PRN
  Administered 2022-05-26: 5 mg via INTRAVENOUS

## 2022-05-26 MED ORDER — BUPIVACAINE HCL 0.25 % IJ SOLN
INTRAMUSCULAR | Status: DC | PRN
  Administered 2022-05-26: 10 mL

## 2022-05-26 MED ORDER — MIDAZOLAM HCL 2 MG/2ML IJ SOLN
INTRAMUSCULAR | Status: DC | PRN
  Administered 2022-05-26: 2 mg via INTRAVENOUS

## 2022-05-26 MED ORDER — SUGAMMADEX SODIUM 200 MG/2ML IV SOLN
INTRAVENOUS | Status: DC | PRN
  Administered 2022-05-26: 200 mg via INTRAVENOUS

## 2022-05-26 MED ORDER — 0.9 % SODIUM CHLORIDE (POUR BTL) OPTIME
TOPICAL | Status: DC | PRN
  Administered 2022-05-26: 1000 mL
  Administered 2022-05-26: 500 mL

## 2022-05-26 MED ORDER — BUPIVACAINE LIPOSOME 1.3 % IJ SUSP
INTRAMUSCULAR | Status: AC
  Filled 2022-05-26: qty 20

## 2022-05-26 MED ORDER — PHENYLEPHRINE HCL (PRESSORS) 10 MG/ML IV SOLN
INTRAVENOUS | Status: DC | PRN
  Administered 2022-05-26: 80 ug via INTRAVENOUS

## 2022-05-26 MED ORDER — FENTANYL CITRATE (PF) 100 MCG/2ML IJ SOLN
INTRAMUSCULAR | Status: DC | PRN
  Administered 2022-05-26: 50 ug via INTRAVENOUS

## 2022-05-26 MED ORDER — LIDOCAINE HCL (PF) 2 % IJ SOLN
INTRAMUSCULAR | Status: AC
  Filled 2022-05-26: qty 5

## 2022-05-26 MED ORDER — LIDOCAINE HCL (PF) 1 % IJ SOLN
INTRAMUSCULAR | Status: AC
  Filled 2022-05-26: qty 30

## 2022-05-26 MED ORDER — ROCURONIUM BROMIDE 100 MG/10ML IV SOLN
INTRAVENOUS | Status: DC | PRN
  Administered 2022-05-26: 50 mg via INTRAVENOUS
  Administered 2022-05-26: 20 mg via INTRAVENOUS
  Administered 2022-05-26: 10 mg via INTRAVENOUS
  Administered 2022-05-26: 20 mg via INTRAVENOUS

## 2022-05-26 MED ORDER — PROPOFOL 10 MG/ML IV BOLUS
INTRAVENOUS | Status: DC | PRN
  Administered 2022-05-26: 150 mg via INTRAVENOUS
  Administered 2022-05-26: 50 mg via INTRAVENOUS

## 2022-05-26 MED ORDER — PHENYLEPHRINE HCL-NACL 20-0.9 MG/250ML-% IV SOLN
INTRAVENOUS | Status: DC | PRN
  Administered 2022-05-26: 25 ug/min via INTRAVENOUS

## 2022-05-26 MED ORDER — FENTANYL CITRATE (PF) 100 MCG/2ML IJ SOLN
INTRAMUSCULAR | Status: AC
  Filled 2022-05-26: qty 2

## 2022-05-26 MED ORDER — ONDANSETRON HCL 4 MG/2ML IJ SOLN
INTRAMUSCULAR | Status: DC | PRN
  Administered 2022-05-26: 4 mg via INTRAVENOUS

## 2022-05-26 MED ORDER — CHLORHEXIDINE GLUCONATE 0.12 % MT SOLN
OROMUCOSAL | Status: AC
  Administered 2022-05-26: 15 mL via OROMUCOSAL
  Filled 2022-05-26: qty 15

## 2022-05-26 MED ORDER — DEXAMETHASONE SODIUM PHOSPHATE 10 MG/ML IJ SOLN
INTRAMUSCULAR | Status: AC
  Filled 2022-05-26: qty 1

## 2022-05-26 MED ORDER — EPHEDRINE 5 MG/ML INJ
INTRAVENOUS | Status: AC
  Filled 2022-05-26: qty 5

## 2022-05-26 MED ORDER — LIDOCAINE HCL (PF) 1 % IJ SOLN
INTRAMUSCULAR | Status: DC | PRN
  Administered 2022-05-26: 20 mL

## 2022-05-26 MED ORDER — BUPIVACAINE HCL (PF) 0.25 % IJ SOLN
INTRAMUSCULAR | Status: AC
  Filled 2022-05-26: qty 30

## 2022-05-26 MED ORDER — MIDAZOLAM HCL 2 MG/2ML IJ SOLN
INTRAMUSCULAR | Status: AC
  Filled 2022-05-26: qty 2

## 2022-05-26 MED ORDER — CEFAZOLIN SODIUM-DEXTROSE 2-4 GM/100ML-% IV SOLN
INTRAVENOUS | Status: AC
  Filled 2022-05-26: qty 100

## 2022-05-26 MED ORDER — FAMOTIDINE 20 MG PO TABS
ORAL_TABLET | ORAL | Status: AC
  Administered 2022-05-26: 20 mg via ORAL
  Filled 2022-05-26: qty 1

## 2022-05-26 MED ORDER — LIDOCAINE HCL (PF) 1 % IJ SOLN
INTRAMUSCULAR | Status: AC
  Filled 2022-05-26: qty 10

## 2022-05-26 SURGICAL SUPPLY — 83 items
APL SKNCLS STERI-STRIP NONHPOA (GAUZE/BANDAGES/DRESSINGS) ×1
BENZOIN TINCTURE PRP APPL 2/3 (GAUZE/BANDAGES/DRESSINGS) ×1 IMPLANT
BIT DRILL 2 FENESTRATED (MISCELLANEOUS) IMPLANT
BIT DRILL CANNULATED 4.6 (BIT) ×1 IMPLANT
BIT DRILL CNTRSNK MONSTER 7.0 (DRILL) IMPLANT
BIT DRILL LONG 3.1X160 (DRILL) IMPLANT
BIT DRILL SOLID LONG 2.8X160 (DRILL) IMPLANT
BIT DRILLL 2 FENESTRATED (MISCELLANEOUS) ×1
BNDG CMPR 75X21 PLY HI ABS (MISCELLANEOUS) ×1
BNDG CMPR STD VLCR NS LF 5.8X4 (GAUZE/BANDAGES/DRESSINGS) ×2
BNDG COHESIVE 4X5 TAN ST LF (GAUZE/BANDAGES/DRESSINGS) ×2 IMPLANT
BNDG CONFORM 3 STRL LF (GAUZE/BANDAGES/DRESSINGS) ×2 IMPLANT
BNDG ELASTIC 4X5.8 VLCR NS LF (GAUZE/BANDAGES/DRESSINGS) ×4 IMPLANT
BNDG ESMARK 4X12 TAN STRL LF (GAUZE/BANDAGES/DRESSINGS) ×2 IMPLANT
BNDG GAUZE DERMACEA FLUFF (GAUZE/BANDAGES/DRESSINGS) ×1
BNDG GAUZE DERMACEA FLUFF 4 (GAUZE/BANDAGES/DRESSINGS) ×1 IMPLANT
BNDG GZE DERMACEA 4 6PLY (GAUZE/BANDAGES/DRESSINGS) ×1
BONE MATRIX TRINITY 10CC (Bone Implant) ×1 IMPLANT
BOOT STEPPER DURA LG (SOFTGOODS) ×1 IMPLANT
BUR 4X45 EGG (BURR) ×1 IMPLANT
CUFF TOURN SGL QUICK 18X4 (TOURNIQUET CUFF) ×1 IMPLANT
DRAPE C-ARM XRAY 36X54 (DRAPES) ×2 IMPLANT
DRAPE C-ARMOR (DRAPES) ×2 IMPLANT
DRILL COUNTERSINK MONSTER 7.0 (DRILL) ×2
DRILL LONG 3.1X160 (DRILL) ×2
DRILL SOLID LONG 2.8X160 (DRILL) ×2
DURAPREP 26ML APPLICATOR (WOUND CARE) ×2 IMPLANT
ELECT REM PT RETURN 9FT ADLT (ELECTROSURGICAL) ×2
ELECTRODE REM PT RTRN 9FT ADLT (ELECTROSURGICAL) ×1 IMPLANT
GAUZE SPONGE 4X4 12PLY STRL (GAUZE/BANDAGES/DRESSINGS) ×2 IMPLANT
GAUZE STRETCH 2X75IN STRL (MISCELLANEOUS) ×2 IMPLANT
GAUZE XEROFORM 1X8 LF (GAUZE/BANDAGES/DRESSINGS) ×2 IMPLANT
GLOVE BIO SURGEON STRL SZ7.5 (GLOVE) ×2 IMPLANT
GLOVE SURG UNDER LTX SZ8 (GLOVE) ×2 IMPLANT
GOWN STRL REUS W/ TWL XL LVL3 (GOWN DISPOSABLE) ×2 IMPLANT
GOWN STRL REUS W/TWL XL LVL3 (GOWN DISPOSABLE) ×4
K-WIRE SINGLE SMOOTH 2.0X200 (WIRE) ×4
K-WIRE SINGLE TROCAR 2.3X230 (WIRE) ×4
KWIRE SINGLE SMOOTH 2.0X200 (WIRE) IMPLANT
KWIRE SINGLE TROCAR 2.3X230 (WIRE) IMPLANT
LABEL OR SOLS (LABEL) ×2 IMPLANT
MANIFOLD NEPTUNE II (INSTRUMENTS) ×2 IMPLANT
NS IRRIG 1000ML POUR BTL (IV SOLUTION) ×1 IMPLANT
NS IRRIG 500ML POUR BTL (IV SOLUTION) ×3 IMPLANT
PACK EXTREMITY ARMC (MISCELLANEOUS) ×2 IMPLANT
PAD PREP 24X41 OB/GYN DISP (PERSONAL CARE ITEMS) ×2 IMPLANT
PENCIL ELECTRO HAND CTR (MISCELLANEOUS) ×2 IMPLANT
PENCIL SMOKE EVACUATOR (MISCELLANEOUS) ×1 IMPLANT
PLATE ANT TT REV RT (Plate) ×1 IMPLANT
RASP SM TEAR CROSS CUT (RASP) ×1 IMPLANT
SCREW CANN HDLS 7.0X65 (Screw) ×1 IMPLANT
SCREW LOCK PLATE R3 4.2X18 (Screw) ×2 IMPLANT
SCREW LOCK PLATE R3 4.2X20 (Screw) ×1 IMPLANT
SCREW LOCK PLATE R3 4.2X26 (Screw) ×2 IMPLANT
SCREW LOCK PLATE R3 4.2X28 (Screw) ×2 IMPLANT
SCREW LOCK PLATE SB 4.5X32 (Screw) ×1 IMPLANT
SCREW LOCK PLATE SB 4.5X36 (Screw) ×1 IMPLANT
SCREW LOCK PLT 18X4.2X GRLL (Screw) IMPLANT
SCREW LOCK PLT 26X4.2X GRLL (Screw) IMPLANT
SCREW LOCK PLT 28X4.2X GRLL (Screw) IMPLANT
SCREW LOCK SB 4.5X34 (Screw) ×1 IMPLANT
SCREW NONLOCK 4.5X24 (Screw) ×1 IMPLANT
SPLINT CAST 1 STEP 4X30 (MISCELLANEOUS) ×2 IMPLANT
SPLINT FAST PLASTER 5X30 (CAST SUPPLIES) ×1
SPLINT PLASTER CAST FAST 5X30 (CAST SUPPLIES) ×1 IMPLANT
SPONGE T-LAP 18X18 ~~LOC~~+RFID (SPONGE) ×2 IMPLANT
STAPLER SKIN PROX 35W (STAPLE) ×1 IMPLANT
STIMULATOR BONE (ORTHOPEDIC SUPPLIES) ×1
STIMULATOR BONE GROWTH EMG EXT (ORTHOPEDIC SUPPLIES) IMPLANT
STOCKINETTE M/LG 89821 (MISCELLANEOUS) ×2 IMPLANT
STRAP SAFETY 5IN WIDE (MISCELLANEOUS) ×2 IMPLANT
STRIP CLOSURE SKIN 1/4X4 (GAUZE/BANDAGES/DRESSINGS) ×1 IMPLANT
SUT ETHILON 3-0 FS-10 30 BLK (SUTURE) ×2
SUT MNCRL 4-0 (SUTURE) ×2
SUT MNCRL 4-0 27XMFL (SUTURE) ×1
SUT VIC AB 2-0 CT1 27 (SUTURE) ×2
SUT VIC AB 2-0 CT1 TAPERPNT 27 (SUTURE) ×1 IMPLANT
SUT VIC AB 3-0 SH 27 (SUTURE) ×2
SUT VIC AB 3-0 SH 27X BRD (SUTURE) ×1 IMPLANT
SUTURE EHLN 3-0 FS-10 30 BLK (SUTURE) IMPLANT
SUTURE MNCRL 4-0 27XMF (SUTURE) IMPLANT
WIRE OLIVE HALF 1.6X80 (WIRE) ×2 IMPLANT
WIRE Z .062 C-WIRE SPADE TIP (WIRE) ×4 IMPLANT

## 2022-05-26 NOTE — Anesthesia Postprocedure Evaluation (Signed)
Anesthesia Post Note  Patient: Daryl Gonzalez  Procedure(s) Performed: ARTHRODESIS ANKLE (Right: Ankle)  Patient location during evaluation: PACU Anesthesia Type: General Level of consciousness: awake and alert Pain management: pain level controlled Vital Signs Assessment: post-procedure vital signs reviewed and stable Respiratory status: spontaneous breathing, nonlabored ventilation, respiratory function stable and patient connected to nasal cannula oxygen Cardiovascular status: blood pressure returned to baseline and stable Postop Assessment: no apparent nausea or vomiting Anesthetic complications: no   No notable events documented.   Last Vitals:  Vitals:   05/26/22 1145 05/26/22 1218  BP: 127/76 138/81  Pulse: 75 73  Resp: 17 16  Temp:  (!) 36.3 C  SpO2: 96% 99%    Last Pain:  Vitals:   05/26/22 1218  TempSrc: Temporal  PainSc: 0-No pain                 Lenard Simmer

## 2022-05-26 NOTE — H&P (Signed)
HISTORY AND PHYSICAL INTERVAL NOTE:  05/26/2022  7:25 AM  Daryl Gonzalez  has presented today for surgery, with the diagnosis of ankle.  The various methods of treatment have been discussed with the patient.  No guarantees were given.  After consideration of risks, benefits and other options for treatment, the patient has consented to surgery.  I have reviewed the patients' chart and labs.     A history and physical examination was performed in my office.  The patient was reexamined.  There have been no changes to this history and physical examination.  Daryl Gonzalez A

## 2022-06-01 DIAGNOSIS — M79671 Pain in right foot: Secondary | ICD-10-CM | POA: Diagnosis not present

## 2022-07-06 DIAGNOSIS — M19071 Primary osteoarthritis, right ankle and foot: Secondary | ICD-10-CM | POA: Diagnosis not present

## 2022-07-06 DIAGNOSIS — Z9889 Other specified postprocedural states: Secondary | ICD-10-CM | POA: Diagnosis not present

## 2022-07-27 DIAGNOSIS — E119 Type 2 diabetes mellitus without complications: Secondary | ICD-10-CM | POA: Diagnosis not present

## 2022-08-03 DIAGNOSIS — I10 Essential (primary) hypertension: Secondary | ICD-10-CM | POA: Diagnosis not present

## 2022-08-03 DIAGNOSIS — E119 Type 2 diabetes mellitus without complications: Secondary | ICD-10-CM | POA: Diagnosis not present

## 2022-08-03 DIAGNOSIS — G2581 Restless legs syndrome: Secondary | ICD-10-CM | POA: Diagnosis not present

## 2022-08-03 DIAGNOSIS — E669 Obesity, unspecified: Secondary | ICD-10-CM | POA: Diagnosis not present

## 2022-08-16 DIAGNOSIS — E119 Type 2 diabetes mellitus without complications: Secondary | ICD-10-CM | POA: Diagnosis not present

## 2022-08-17 DIAGNOSIS — M19071 Primary osteoarthritis, right ankle and foot: Secondary | ICD-10-CM | POA: Diagnosis not present

## 2022-08-28 DIAGNOSIS — M25571 Pain in right ankle and joints of right foot: Secondary | ICD-10-CM | POA: Diagnosis not present

## 2022-08-28 DIAGNOSIS — M6281 Muscle weakness (generalized): Secondary | ICD-10-CM | POA: Diagnosis not present

## 2022-09-01 DIAGNOSIS — Z981 Arthrodesis status: Secondary | ICD-10-CM | POA: Diagnosis not present

## 2022-09-01 DIAGNOSIS — M25571 Pain in right ankle and joints of right foot: Secondary | ICD-10-CM | POA: Diagnosis not present

## 2022-09-04 DIAGNOSIS — Z981 Arthrodesis status: Secondary | ICD-10-CM | POA: Diagnosis not present

## 2022-09-04 DIAGNOSIS — M25571 Pain in right ankle and joints of right foot: Secondary | ICD-10-CM | POA: Diagnosis not present

## 2022-09-06 DIAGNOSIS — Z981 Arthrodesis status: Secondary | ICD-10-CM | POA: Diagnosis not present

## 2022-09-11 DIAGNOSIS — M25571 Pain in right ankle and joints of right foot: Secondary | ICD-10-CM | POA: Diagnosis not present

## 2022-09-11 DIAGNOSIS — Z981 Arthrodesis status: Secondary | ICD-10-CM | POA: Diagnosis not present

## 2022-09-13 DIAGNOSIS — Z981 Arthrodesis status: Secondary | ICD-10-CM | POA: Diagnosis not present

## 2022-09-13 DIAGNOSIS — M25571 Pain in right ankle and joints of right foot: Secondary | ICD-10-CM | POA: Diagnosis not present

## 2022-09-25 DIAGNOSIS — M79605 Pain in left leg: Secondary | ICD-10-CM | POA: Diagnosis not present

## 2022-09-29 DIAGNOSIS — M47816 Spondylosis without myelopathy or radiculopathy, lumbar region: Secondary | ICD-10-CM | POA: Diagnosis not present

## 2022-09-29 DIAGNOSIS — M545 Low back pain, unspecified: Secondary | ICD-10-CM | POA: Diagnosis not present

## 2022-09-29 DIAGNOSIS — M25552 Pain in left hip: Secondary | ICD-10-CM | POA: Diagnosis not present

## 2022-09-29 DIAGNOSIS — M79605 Pain in left leg: Secondary | ICD-10-CM | POA: Diagnosis not present

## 2022-09-29 DIAGNOSIS — M8588 Other specified disorders of bone density and structure, other site: Secondary | ICD-10-CM | POA: Diagnosis not present

## 2022-10-12 DIAGNOSIS — M1612 Unilateral primary osteoarthritis, left hip: Secondary | ICD-10-CM | POA: Diagnosis not present

## 2022-10-12 DIAGNOSIS — M1712 Unilateral primary osteoarthritis, left knee: Secondary | ICD-10-CM | POA: Diagnosis not present

## 2022-10-12 DIAGNOSIS — M79605 Pain in left leg: Secondary | ICD-10-CM | POA: Diagnosis not present

## 2022-10-12 DIAGNOSIS — M5136 Other intervertebral disc degeneration, lumbar region: Secondary | ICD-10-CM | POA: Diagnosis not present

## 2022-10-12 DIAGNOSIS — M47816 Spondylosis without myelopathy or radiculopathy, lumbar region: Secondary | ICD-10-CM | POA: Diagnosis not present

## 2022-10-12 DIAGNOSIS — M5137 Other intervertebral disc degeneration, lumbosacral region: Secondary | ICD-10-CM | POA: Diagnosis not present

## 2022-11-01 DIAGNOSIS — E119 Type 2 diabetes mellitus without complications: Secondary | ICD-10-CM | POA: Diagnosis not present

## 2022-11-08 DIAGNOSIS — E669 Obesity, unspecified: Secondary | ICD-10-CM | POA: Diagnosis not present

## 2022-11-08 DIAGNOSIS — I10 Essential (primary) hypertension: Secondary | ICD-10-CM | POA: Diagnosis not present

## 2022-11-08 DIAGNOSIS — E119 Type 2 diabetes mellitus without complications: Secondary | ICD-10-CM | POA: Diagnosis not present

## 2022-11-08 DIAGNOSIS — E876 Hypokalemia: Secondary | ICD-10-CM | POA: Diagnosis not present

## 2022-11-08 DIAGNOSIS — E782 Mixed hyperlipidemia: Secondary | ICD-10-CM | POA: Diagnosis not present

## 2022-11-08 DIAGNOSIS — R7989 Other specified abnormal findings of blood chemistry: Secondary | ICD-10-CM | POA: Diagnosis not present

## 2022-11-16 DIAGNOSIS — M19071 Primary osteoarthritis, right ankle and foot: Secondary | ICD-10-CM | POA: Diagnosis not present

## 2022-11-16 DIAGNOSIS — E119 Type 2 diabetes mellitus without complications: Secondary | ICD-10-CM | POA: Diagnosis not present

## 2022-12-15 DIAGNOSIS — E876 Hypokalemia: Secondary | ICD-10-CM | POA: Diagnosis not present

## 2022-12-15 DIAGNOSIS — R7989 Other specified abnormal findings of blood chemistry: Secondary | ICD-10-CM | POA: Diagnosis not present

## 2023-01-30 DIAGNOSIS — E782 Mixed hyperlipidemia: Secondary | ICD-10-CM | POA: Diagnosis not present

## 2023-01-30 DIAGNOSIS — E119 Type 2 diabetes mellitus without complications: Secondary | ICD-10-CM | POA: Diagnosis not present

## 2023-02-19 DIAGNOSIS — E119 Type 2 diabetes mellitus without complications: Secondary | ICD-10-CM | POA: Diagnosis not present

## 2023-02-19 DIAGNOSIS — I10 Essential (primary) hypertension: Secondary | ICD-10-CM | POA: Diagnosis not present

## 2023-02-19 DIAGNOSIS — E782 Mixed hyperlipidemia: Secondary | ICD-10-CM | POA: Diagnosis not present

## 2023-02-19 DIAGNOSIS — Z125 Encounter for screening for malignant neoplasm of prostate: Secondary | ICD-10-CM | POA: Diagnosis not present

## 2023-02-19 DIAGNOSIS — E669 Obesity, unspecified: Secondary | ICD-10-CM | POA: Diagnosis not present

## 2023-03-28 IMAGING — RF DG ANKLE 2V *R*
1 series · 3 of 3 positions shown · non-contrast
Comparison: None Available.

CLINICAL DATA: Status post right ankle arthrodesis.

EXAM:
RIGHT ANKLE - 2 VIEW

[Series 1: run · 3 of 3 slices shown]
[im 1/3]
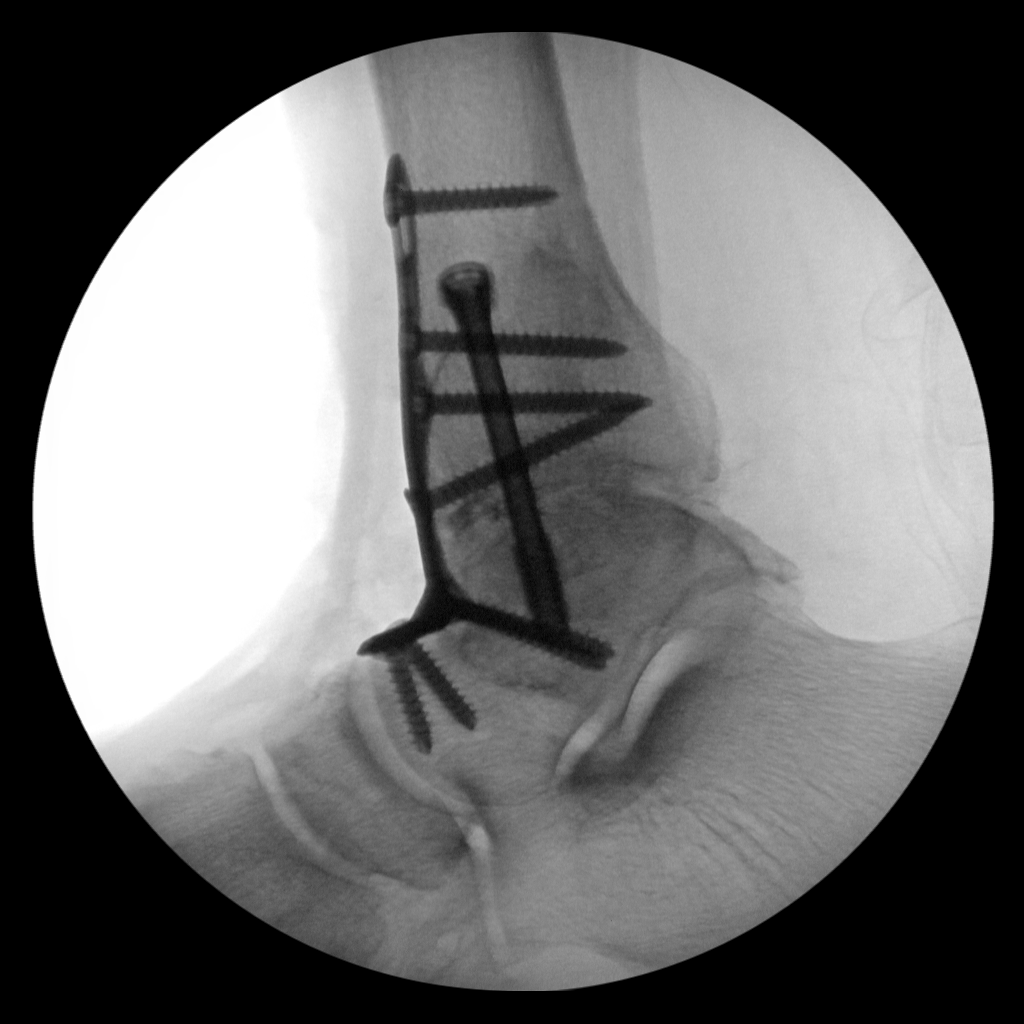
[im 2/3]
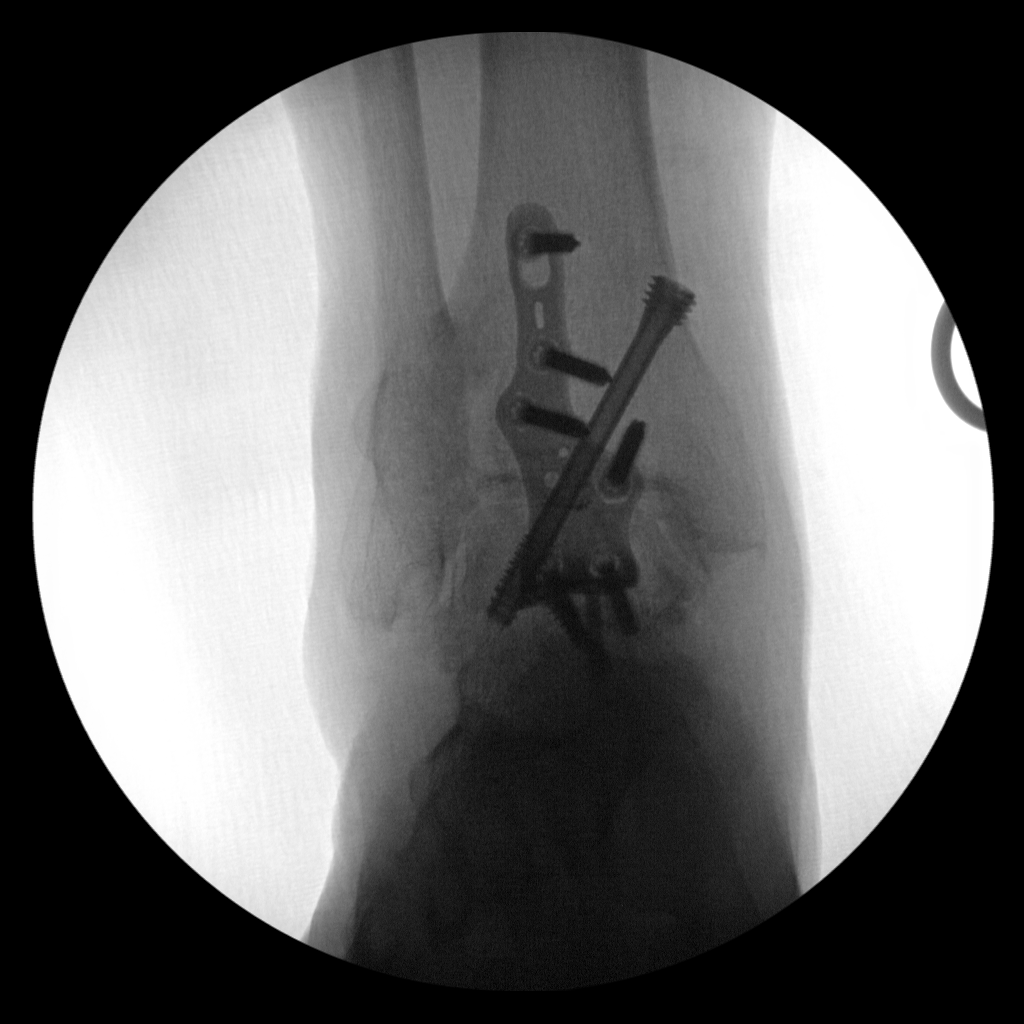
[im 3/3]
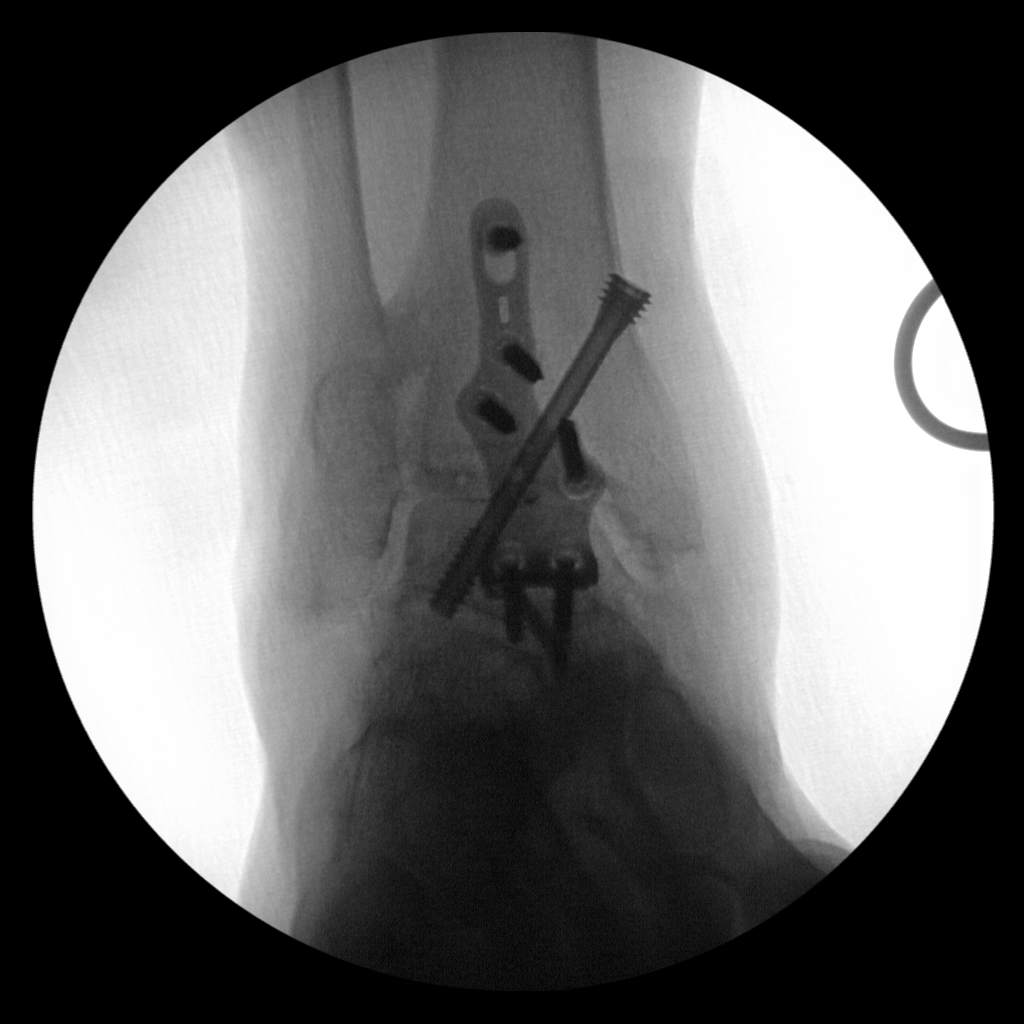

[3 of 3 positions shown; findings below may reference images not displayed]

FINDINGS: Images were performed intraoperatively without the presence of a
radiologist. There is anterior tibiotalar plate and screw fixation.
Additional oblique screw from medial distal tibial metadiaphyseal
approach also fixates the tibiotalar joint. Mild tibiotalar joint
space narrowing and peripheral osteophytosis. No evidence of
hardware failure.

Total fluoroscopy images: 3

Total fluoroscopy time: 72 seconds

Please see intraoperative findings for further detail.
IMPRESSION: Intraoperative fluoro for tibiotalar arthrodesis.

## 2023-04-20 DIAGNOSIS — E782 Mixed hyperlipidemia: Secondary | ICD-10-CM | POA: Diagnosis not present

## 2023-04-20 DIAGNOSIS — E119 Type 2 diabetes mellitus without complications: Secondary | ICD-10-CM | POA: Diagnosis not present

## 2023-04-20 DIAGNOSIS — I10 Essential (primary) hypertension: Secondary | ICD-10-CM | POA: Diagnosis not present

## 2023-04-20 DIAGNOSIS — I119 Hypertensive heart disease without heart failure: Secondary | ICD-10-CM | POA: Diagnosis not present

## 2023-04-20 DIAGNOSIS — I38 Endocarditis, valve unspecified: Secondary | ICD-10-CM | POA: Diagnosis not present

## 2023-04-20 DIAGNOSIS — I517 Cardiomegaly: Secondary | ICD-10-CM | POA: Diagnosis not present

## 2023-05-07 ENCOUNTER — Other Ambulatory Visit: Payer: Self-pay | Admitting: Hematology and Oncology

## 2023-05-07 DIAGNOSIS — L989 Disorder of the skin and subcutaneous tissue, unspecified: Secondary | ICD-10-CM

## 2023-05-14 DIAGNOSIS — E119 Type 2 diabetes mellitus without complications: Secondary | ICD-10-CM | POA: Diagnosis not present

## 2023-05-14 DIAGNOSIS — H2513 Age-related nuclear cataract, bilateral: Secondary | ICD-10-CM | POA: Diagnosis not present

## 2023-08-14 DIAGNOSIS — I10 Essential (primary) hypertension: Secondary | ICD-10-CM | POA: Diagnosis not present

## 2023-08-14 DIAGNOSIS — Z125 Encounter for screening for malignant neoplasm of prostate: Secondary | ICD-10-CM | POA: Diagnosis not present

## 2023-08-14 DIAGNOSIS — E119 Type 2 diabetes mellitus without complications: Secondary | ICD-10-CM | POA: Diagnosis not present

## 2023-08-21 DIAGNOSIS — E876 Hypokalemia: Secondary | ICD-10-CM | POA: Diagnosis not present

## 2023-08-21 DIAGNOSIS — Z0000 Encounter for general adult medical examination without abnormal findings: Secondary | ICD-10-CM | POA: Diagnosis not present

## 2023-08-21 DIAGNOSIS — E119 Type 2 diabetes mellitus without complications: Secondary | ICD-10-CM | POA: Diagnosis not present

## 2023-08-21 DIAGNOSIS — D649 Anemia, unspecified: Secondary | ICD-10-CM | POA: Diagnosis not present

## 2023-09-14 DIAGNOSIS — D649 Anemia, unspecified: Secondary | ICD-10-CM | POA: Diagnosis not present

## 2023-09-20 DIAGNOSIS — E876 Hypokalemia: Secondary | ICD-10-CM | POA: Diagnosis not present

## 2023-09-20 DIAGNOSIS — D649 Anemia, unspecified: Secondary | ICD-10-CM | POA: Diagnosis not present

## 2024-02-11 DIAGNOSIS — E119 Type 2 diabetes mellitus without complications: Secondary | ICD-10-CM | POA: Diagnosis not present

## 2024-04-23 DIAGNOSIS — I517 Cardiomegaly: Secondary | ICD-10-CM | POA: Diagnosis not present

## 2024-04-23 DIAGNOSIS — E119 Type 2 diabetes mellitus without complications: Secondary | ICD-10-CM | POA: Diagnosis not present

## 2024-04-23 DIAGNOSIS — R600 Localized edema: Secondary | ICD-10-CM | POA: Diagnosis not present

## 2024-04-23 DIAGNOSIS — I10 Essential (primary) hypertension: Secondary | ICD-10-CM | POA: Diagnosis not present

## 2024-04-23 DIAGNOSIS — I38 Endocarditis, valve unspecified: Secondary | ICD-10-CM | POA: Diagnosis not present

## 2024-04-23 DIAGNOSIS — E782 Mixed hyperlipidemia: Secondary | ICD-10-CM | POA: Diagnosis not present

## 2024-04-23 DIAGNOSIS — I119 Hypertensive heart disease without heart failure: Secondary | ICD-10-CM | POA: Diagnosis not present

## 2024-04-23 DIAGNOSIS — I4510 Unspecified right bundle-branch block: Secondary | ICD-10-CM | POA: Diagnosis not present

## 2024-04-24 ENCOUNTER — Other Ambulatory Visit: Payer: Self-pay | Admitting: Nurse Practitioner

## 2024-04-24 DIAGNOSIS — I10 Essential (primary) hypertension: Secondary | ICD-10-CM

## 2024-04-30 ENCOUNTER — Other Ambulatory Visit: Payer: Self-pay | Admitting: Nurse Practitioner

## 2024-04-30 ENCOUNTER — Encounter: Payer: Self-pay | Admitting: Nurse Practitioner

## 2024-04-30 DIAGNOSIS — E119 Type 2 diabetes mellitus without complications: Secondary | ICD-10-CM

## 2024-04-30 DIAGNOSIS — I38 Endocarditis, valve unspecified: Secondary | ICD-10-CM

## 2024-04-30 DIAGNOSIS — I4510 Unspecified right bundle-branch block: Secondary | ICD-10-CM

## 2024-04-30 DIAGNOSIS — I119 Hypertensive heart disease without heart failure: Secondary | ICD-10-CM

## 2024-04-30 DIAGNOSIS — E782 Mixed hyperlipidemia: Secondary | ICD-10-CM

## 2024-04-30 DIAGNOSIS — I10 Essential (primary) hypertension: Secondary | ICD-10-CM

## 2024-05-02 ENCOUNTER — Ambulatory Visit
Admission: RE | Admit: 2024-05-02 | Discharge: 2024-05-02 | Disposition: A | Discharge status: Home / Self Care | Payer: Self-pay | Source: Ambulatory Visit | Attending: Nurse Practitioner | Admitting: Nurse Practitioner

## 2024-05-02 DIAGNOSIS — E782 Mixed hyperlipidemia: Secondary | ICD-10-CM | POA: Insufficient documentation

## 2024-05-02 DIAGNOSIS — E119 Type 2 diabetes mellitus without complications: Secondary | ICD-10-CM | POA: Insufficient documentation

## 2024-05-02 DIAGNOSIS — I38 Endocarditis, valve unspecified: Secondary | ICD-10-CM | POA: Insufficient documentation

## 2024-05-02 DIAGNOSIS — I10 Essential (primary) hypertension: Secondary | ICD-10-CM | POA: Insufficient documentation

## 2024-05-02 DIAGNOSIS — I4510 Unspecified right bundle-branch block: Secondary | ICD-10-CM | POA: Insufficient documentation

## 2024-05-02 DIAGNOSIS — I119 Hypertensive heart disease without heart failure: Secondary | ICD-10-CM | POA: Insufficient documentation

## 2024-05-05 DIAGNOSIS — I119 Hypertensive heart disease without heart failure: Secondary | ICD-10-CM | POA: Diagnosis not present

## 2024-05-05 DIAGNOSIS — I4510 Unspecified right bundle-branch block: Secondary | ICD-10-CM | POA: Diagnosis not present

## 2024-05-07 ENCOUNTER — Encounter: Payer: Self-pay | Admitting: Dermatology

## 2024-05-07 ENCOUNTER — Ambulatory Visit: Payer: PPO | Admitting: Dermatology

## 2024-05-07 DIAGNOSIS — Z7189 Other specified counseling: Secondary | ICD-10-CM

## 2024-05-07 DIAGNOSIS — D1801 Hemangioma of skin and subcutaneous tissue: Secondary | ICD-10-CM | POA: Diagnosis not present

## 2024-05-07 DIAGNOSIS — L570 Actinic keratosis: Secondary | ICD-10-CM

## 2024-05-07 DIAGNOSIS — L821 Other seborrheic keratosis: Secondary | ICD-10-CM | POA: Diagnosis not present

## 2024-05-07 DIAGNOSIS — Z872 Personal history of diseases of the skin and subcutaneous tissue: Secondary | ICD-10-CM

## 2024-05-07 DIAGNOSIS — Z79899 Other long term (current) drug therapy: Secondary | ICD-10-CM

## 2024-05-07 DIAGNOSIS — W908XXA Exposure to other nonionizing radiation, initial encounter: Secondary | ICD-10-CM | POA: Diagnosis not present

## 2024-05-07 DIAGNOSIS — D229 Melanocytic nevi, unspecified: Secondary | ICD-10-CM

## 2024-05-07 DIAGNOSIS — L814 Other melanin hyperpigmentation: Secondary | ICD-10-CM

## 2024-05-07 DIAGNOSIS — Z1283 Encounter for screening for malignant neoplasm of skin: Secondary | ICD-10-CM

## 2024-05-07 DIAGNOSIS — L918 Other hypertrophic disorders of the skin: Secondary | ICD-10-CM | POA: Diagnosis not present

## 2024-05-07 DIAGNOSIS — L820 Inflamed seborrheic keratosis: Secondary | ICD-10-CM | POA: Diagnosis not present

## 2024-05-07 DIAGNOSIS — L578 Other skin changes due to chronic exposure to nonionizing radiation: Secondary | ICD-10-CM

## 2024-05-07 DIAGNOSIS — Z5111 Encounter for antineoplastic chemotherapy: Secondary | ICD-10-CM

## 2024-05-07 DIAGNOSIS — D692 Other nonthrombocytopenic purpura: Secondary | ICD-10-CM

## 2024-05-07 MED ORDER — FLUOROURACIL 5 % EX CREA: TOPICAL_CREAM | CUTANEOUS | 2 refills | Status: AC

## 2024-05-07 NOTE — Progress Notes (Deleted)
   New Patient Visit   Subjective  Daryl Gonzalez is a 68 y.o. male who presents for the following: Spot  The patient has spots, moles and lesions to be evaluated, some may be new or changing and the patient may have concern these could be cancer. ***   The following portions of the chart were reviewed this encounter and updated as appropriate: medications, allergies, medical history  Review of Systems:  No other skin or systemic complaints except as noted in HPI or Assessment and Plan.  Objective  Well appearing patient in no apparent distress; mood and affect are within normal limits.  A focused examination was performed of the following areas: *** Relevant physical exam findings are noted in the Assessment and Plan.    Assessment & Plan      No follow-ups on file.  I, Amandajo Gonder, CMA, am acting as scribe for Celine Collard, MD.   Documentation: I have reviewed the above documentation for accuracy and completeness, and I agree with the above.  Celine Collard, MD

## 2024-05-07 NOTE — Patient Instructions (Addendum)
 After July 5th- Start 5-fluorouracil/calcipotriene cream twice a day for 10 days to affected areas including scalp and forehead. Prescription sent to [facility]. Patient advised they will receive an email to purchase the medication online and have it sent to their home. Patient provided with handout reviewing treatment course and side effects and advised to call or message us  on MyChart with any concerns.  Reviewed course of treatment and expected reaction.  Patient advised to expect inflammation and crusting and advised that erosions are possible.  Patient advised to be diligent with sun protection during and after treatment. Counseled to keep medication out of reach of children and pets.    Instructions for [facility] Medications  One or more of your medications was sent to the [facility] mail order [facility] pharmacy. You will receive an email from them and can purchase the medicine through that link. It will then be mailed to your home at the address you confirmed. If for any reason you do not receive an email from them, please check your spam folder. If you still do not find the email, please let us  know. [facility] phone number is [phone].    Cryotherapy Aftercare  Wash gently with soap and water everyday.   Apply Vaseline and Band-Aid daily until healed.   Recommend daily broad spectrum sunscreen SPF 30+ to sun-exposed areas, reapply every 2 hours as needed. Call for new or changing lesions.  Staying in the shade or wearing long sleeves, sun glasses (UVA+UVB protection) and wide brim hats (4-inch brim around the entire circumference of the hat) are also recommended for sun protection.        5-Fluorouracil/Calcipotriene Patient Education   Actinic keratoses are the dry, red scaly spots on the skin caused by sun damage. A portion of these spots can turn into skin cancer with time, and treating them can help prevent development of skin  cancer.   Treatment of these spots requires removal of the defective skin cells. There are various ways to remove actinic keratoses, including freezing with liquid nitrogen, treatment with creams, or treatment with a blue light procedure in the office.   5-fluorouracil cream is a topical cream used to treat actinic keratoses. It works by interfering with the growth of abnormal fast-growing skin cells, such as actinic keratoses. These cells peel off and are replaced by healthy ones.   5-fluorouracil/calcipotriene is a combination of the 5-fluorouracil cream with a vitamin D analog cream called calcipotriene. The calcipotriene alone does not treat actinic keratoses. However, when it is combined with 5-fluorouracil, it helps the 5-fluorouracil treat the actinic keratoses much faster so that the same results can be achieved with a much shorter treatment time.  INSTRUCTIONS FOR 5-FLUOROURACIL/CALCIPOTRIENE CREAM:   5-fluorouracil/calcipotriene cream typically only needs to be used for 4-7 days. A thin layer should be applied twice a day to the treatment areas recommended by your physician.   If your physician prescribed you separate tubes of 5-fluourouracil and calcipotriene, apply a thin layer of 5-fluorouracil followed by a thin layer of calcipotriene.   Avoid contact with your eyes, nostrils, and mouth. Do not use 5-fluorouracil/calcipotriene cream on infected or open wounds.   You will develop redness, irritation and some crusting at areas where you have pre-cancer damage/actinic keratoses. IF YOU DEVELOP PAIN, BLEEDING, OR SIGNIFICANT CRUSTING, STOP THE TREATMENT EARLY - you have already gotten a good response and the actinic keratoses should clear up well.  Wash your hands after applying 5-fluorouracil 5% cream  on your skin.   A moisturizer or sunscreen with a minimum SPF 30 should be applied each morning.   Once you have finished the treatment, you can apply a thin layer of Vaseline twice a  day to irritated areas to soothe and calm the areas more quickly. If you experience significant discomfort, contact your physician.  For some patients it is necessary to repeat the treatment for best results.  SIDE EFFECTS: When using 5-fluorouracil/calcipotriene cream, you may have mild irritation, such as redness, dryness, swelling, or a mild burning sensation. This usually resolves within 2 weeks. The more actinic keratoses you have, the more redness and inflammation you can expect during treatment. Eye irritation has been reported rarely. If this occurs, please let us  know.  If you have any trouble using this cream, please call the office. If you have any other questions about this information, please do not hesitate to ask me before you leave the office.     Melanoma ABCDEs  Melanoma is the most dangerous type of skin cancer, and is the leading cause of death from skin disease.  You are more likely to develop melanoma if you: Have light-colored skin, light-colored eyes, or red or blond hair Spend a lot of time in the sun Tan regularly, either outdoors or in a tanning bed Have had blistering sunburns, especially during childhood Have a close family member who has had a melanoma Have atypical moles or large birthmarks  Early detection of melanoma is key since treatment is typically straightforward and cure rates are extremely high if we catch it early.   The first sign of melanoma is often a change in a mole or a new dark spot.  The ABCDE system is a way of remembering the signs of melanoma.  A for asymmetry:  The two halves do not match. B for border:  The edges of the growth are irregular. C for color:  A mixture of colors are present instead of an even brown color. D for diameter:  Melanomas are usually (but not always) greater than 6mm - the size of a pencil eraser. E for evolution:  The spot keeps changing in size, shape, and color.  Please check your skin once per month between  visits. You can use a small mirror in front and a large mirror behind you to keep an eye on the back side or your body.   If you see any new or changing lesions before your next follow-up, please call to schedule a visit.  Please continue daily skin protection including broad spectrum sunscreen SPF 30+ to sun-exposed areas, reapplying every 2 hours as needed when you're outdoors.   Staying in the shade or wearing long sleeves, sun glasses (UVA+UVB protection) and wide brim hats (4-inch brim around the entire circumference of the hat) are also recommended for sun protection.      Due to recent changes in healthcare laws, you may see results of your pathology and/or laboratory studies on MyChart before the doctors have had a chance to review them. We understand that in some cases there may be results that are confusing or concerning to you. Please understand that not all results are received at the same time and often the doctors may need to interpret multiple results in order to provide you with the best plan of care or course of treatment. Therefore, we ask that you please give us  2 business days to thoroughly review all your results before contacting the office for clarification. Should  we see a critical lab result, you will be contacted sooner.   If You Need Anything After Your Visit  If you have any questions or concerns for your doctor, please call our main line at [phone] and press option 4 to reach your doctor's medical assistant. If no one answers, please leave a voicemail as directed and we will return your call as soon as possible. Messages left after 4 pm will be answered the following business day.   You may also send us  a message via MyChart. We typically respond to MyChart messages within 1-2 business days.  For prescription refills, please ask your pharmacy to contact our office. Our fax number is [phone].  If you have an urgent issue when the clinic is closed that cannot  wait until the next business day, you can page your doctor at the number below.    Please note that while we do our best to be available for urgent issues outside of office hours, we are not available 24/7.   If you have an urgent issue and are unable to reach us , you may choose to seek medical care at your doctor's office, retail clinic, urgent care center, or emergency room.  If you have a medical emergency, please immediately call 911 or go to the emergency department.  Pager Numbers  - Dr. Bary Likes: [phone]  - Dr. Annette Barters: [phone]  - Dr. Felipe Horton: [phone]   In the event of inclement weather, please call our main line at [phone] for an update on the status of any delays or closures.  Dermatology Medication Tips: Please keep the boxes that topical medications come in in order to help keep track of the instructions about where and how to use these. Pharmacies typically print the medication instructions only on the boxes and not directly on the medication tubes.   If your medication is too expensive, please contact our office at [phone] option 4 or send us  a message through MyChart.   We are unable to tell what your co-pay for medications will be in advance as this is different depending on your insurance coverage. However, we may be able to find a substitute medication at lower cost or fill out paperwork to get insurance to cover a needed medication.   If a prior authorization is required to get your medication covered by your insurance company, please allow us  1-2 business days to complete this process.  Drug prices often vary depending on where the prescription is filled and some pharmacies may offer cheaper prices.  The website www.goodrx.com contains coupons for medications through different pharmacies. The prices here do not account for what the cost may be with help from insurance (it may be cheaper with your insurance), but the website can give you the price  if you did not use any insurance.  - You can print the associated coupon and take it with your prescription to the pharmacy.  - You may also stop by our office during regular business hours and pick up a GoodRx coupon card.  - If you need your prescription sent electronically to a different pharmacy, notify our office through [facility] or by phone at [phone] option 4.     Si Usted Necesita Algo Despus de Su Visita  Tambin puede enviarnos un mensaje a travs de Clinical cytogeneticist. Por lo general respondemos a los mensajes de MyChart en el transcurso de 1 a 2 das hbiles.  Para renovar recetas, por favor pida a su farmacia que se ponga  en contacto con nuestra oficina. Franz Jacks de fax es [place] [phone].  Si tiene un asunto urgente cuando la clnica est cerrada y que no puede esperar hasta el siguiente da hbil, puede llamar/localizar a su doctor(a) al nmero que aparece a continuacin.   Por favor, tenga en cuenta que aunque hacemos todo lo posible para estar disponibles para asuntos urgentes fuera del horario de [place], no estamos disponibles las 24 horas del da, los 7 [address]  [address] de la [place].   Si tiene un problema urgente y no puede comunicarse con nosotros, puede optar por buscar atencin mdica  en el consultorio de su doctor(a), en una clnica privada, en un centro de atencin urgente o en una sala de emergencias.  Si tiene Engineer, drilling, por favor llame inmediatamente al 911 o vaya a la sala de emergencias.  Nmeros de bper  - Dr. Bary Likes: [phone]  - Dra. Annette Barters: [phone]  - Dr. Felipe Horton: [phone]   En caso de inclemencias del tiempo, por favor llame a Lajuan Pila principal al [phone] para una actualizacin sobre el [place] de cualquier retraso o cierre.  Consejos para la medicacin en dermatologa: Por favor, guarde las cajas en las que vienen los medicamentos de uso tpico para ayudarle a seguir las instrucciones sobre dnde y cmo  usarlos. Las farmacias generalmente imprimen las instrucciones del medicamento slo en las cajas y no directamente en los tubos del [place].   Si su medicamento es muy caro, por favor, pngase en contacto con Bettyjane Brunet llamando al [phone] y presione la opcin 4 o envenos un mensaje a travs de Clinical cytogeneticist.   No podemos decirle cul ser su copago por los medicamentos por adelantado ya que esto es diferente dependiendo de la cobertura de su seguro. Sin embargo, es posible que podamos encontrar un medicamento sustituto a Audiological scientist un formulario para que el seguro cubra el medicamento que se considera necesario.   Si se requiere una autorizacin previa para que su compaa de seguros [place] su medicamento, por favor permtanos de 1 a 2 das hbiles para completar este proceso.  Los precios de los medicamentos varan con frecuencia dependiendo del Environmental consultant de dnde se surte la receta y alguna farmacias pueden ofrecer precios ms baratos.  El sitio web www.goodrx.com tiene cupones para medicamentos de Health and safety inspector. Los precios aqu no tienen en cuenta lo que podra costar con la ayuda del seguro (puede ser ms barato con su seguro), pero el sitio web puede darle el precio si no utiliz Tourist information centre manager.  - Puede imprimir el cupn correspondiente y llevarlo con su receta a la farmacia.  - Tambin puede pasar por nuestra oficina durante el horario de atencin regular y Education officer, museum una tarjeta de cupones de GoodRx.  - Si necesita que su receta se enve electrnicamente a una farmacia diferente, informe a nuestra oficina a travs de MyChart de [place] o por telfono llamando al [phone] y presione la opcin 4.

## 2024-05-07 NOTE — Progress Notes (Signed)
 New Patient Visit   Subjective  Daryl Gonzalez is a 68 y.o. male who presents for the following: Skin Cancer Screening and Upper Body Skin Exam. No personal hx of skin cancer. Hx of AKs.   The patient presents for Upper Body Skin Exam (UBSE) for skin cancer screening and mole check. The patient has spots, moles and lesions to be evaluated, some may be new or changing and the patient may have concern these could be cancer.  The following portions of the chart were reviewed this encounter and updated as appropriate: medications, allergies, medical history  Review of Systems:  No other skin or systemic complaints except as noted in HPI or Assessment and Plan.  Objective  Well appearing patient in no apparent distress; mood and affect are within normal limits.  All skin waist up examined. Relevant physical exam findings are noted in the Assessment and Plan.  forehead and scalp x16 (16) Erythematous thin papules/macules with gritty scale.  upper back x2, L forearm x1 (3) Erythematous keratotic or waxy stuck-on papule or plaque.   Assessment & Plan   AK (ACTINIC KERATOSIS) (16) forehead and scalp x16 (16) Actinic keratoses are precancerous spots that appear secondary to cumulative UV radiation exposure/sun exposure over time. They are chronic with expected duration over 1 year. A portion of actinic keratoses will progress to squamous cell carcinoma of the skin. It is not possible to reliably predict which spots will progress to skin cancer and so treatment is recommended to prevent development of skin cancer.  Recommend daily broad spectrum sunscreen SPF 30+ to sun-exposed areas, reapply every 2 hours as needed.  Recommend staying in the shade or wearing long sleeves, sun glasses (UVA+UVB protection) and wide brim hats (4-inch brim around the entire circumference of the hat). Call for new or changing lesions. Destruction of lesion - forehead and scalp x16 (16) Complexity: simple    Destruction method: cryotherapy   Informed consent: discussed and consent obtained   Timeout:  patient name, date of birth, surgical site, and procedure verified Lesion destroyed using liquid nitrogen: Yes   Region frozen until ice ball extended beyond lesion: Yes   Outcome: patient tolerated procedure well with no complications   Post-procedure details: wound care instructions given   Additional details:  Prior to procedure, discussed risks of blister formation, small wound, skin dyspigmentation, or rare scar following cryotherapy. Recommend Vaseline ointment to treated areas while healing.  INFLAMED SEBORRHEIC KERATOSIS (3) upper back x2, L forearm x1 (3) Symptomatic, irritating, patient would like treated.  Recheck on follow up. Bx if not resolved.  Destruction of lesion - upper back x2, L forearm x1 (3) Complexity: simple   Destruction method: cryotherapy   Informed consent: discussed and consent obtained   Timeout:  patient name, date of birth, surgical site, and procedure verified Lesion destroyed using liquid nitrogen: Yes   Region frozen until ice ball extended beyond lesion: Yes   Outcome: patient tolerated procedure well with no complications   Post-procedure details: wound care instructions given   Additional details:  Prior to procedure, discussed risks of blister formation, small wound, skin dyspigmentation, or rare scar following cryotherapy. Recommend Vaseline ointment to treated areas while healing.  Skin cancer screening performed today.  ACTINIC DAMAGE WITH PRECANCEROUS ACTINIC KERATOSES Counseling for Topical Chemotherapy Management: Patient exhibits: - Severe, confluent actinic changes with pre-cancerous actinic keratoses that is secondary to cumulative UV radiation exposure over time - Condition that is severe; chronic, not at goal. - diffuse scaly  erythematous macules and papules with underlying dyspigmentation - Discussed Prescription "Field Treatment" topical  Chemotherapy for Severe, Chronic Confluent Actinic Changes with Pre-Cancerous Actinic Keratoses Field treatment involves treatment of an entire area of skin that has confluent Actinic Changes (Sun/ Ultraviolet light damage) and PreCancerous Actinic Keratoses by method of PhotoDynamic Therapy (PDT) and/or prescription Topical Chemotherapy agents such as 5-fluorouracil, 5-fluorouracil/calcipotriene, and/or imiquimod.  The purpose is to decrease the number of clinically evident and subclinical PreCancerous lesions to prevent progression to development of skin cancer by chemically destroying early precancer changes that may or may not be visible.  It has been shown to reduce the risk of developing skin cancer in the treated area. As a result of treatment, redness, scaling, crusting, and open sores may occur during treatment course. One or more than one of these methods may be used and may have to be used several times to control, suppress and eliminate the PreCancerous changes. Discussed treatment course, expected reaction, and possible side effects. - Recommend daily broad spectrum sunscreen SPF 30+ to sun-exposed areas, reapply every 2 hours as needed.  - Staying in the shade or wearing long sleeves, sun glasses (UVA+UVB protection) and wide brim hats (4-inch brim around the entire circumference of the hat) are also recommended. - Call for new or changing lesions. After July 5th- Start 5-fluorouracil/calcipotriene cream twice a day for 10 days to affected areas including scalp and forehead. Prescription sent to [facility]. Patient advised they will receive an email to purchase the medication online and have it sent to their home. Patient provided with handout reviewing treatment course and side effects and advised to call or message us  on MyChart with any concerns.  Reviewed course of treatment and expected reaction.  Patient advised to expect inflammation and crusting and advised that  erosions are possible.  Patient advised to be diligent with sun protection during and after treatment. Counseled to keep medication out of reach of children and pets.   Lentigines, Seborrheic Keratoses, Hemangiomas - Benign normal skin lesions - Benign-appearing - Call for any changes  Melanocytic Nevi - Tan-brown and/or pink-flesh-colored symmetric macules and papules - Benign appearing on exam today - Observation - Call clinic for new or changing moles - Recommend daily use of broad spectrum spf 30+ sunscreen to sun-exposed areas.   Acrochordons (Skin Tags) - Fleshy, skin-colored pedunculated papules at axillae - Benign appearing.  - Observe. - If desired, they can be removed with an in office procedure that is not covered by insurance. - Please call the clinic if you notice any new or changing lesions.   Purpura - Chronic; persistent and recurrent.  Treatable, but not curable. - Violaceous macules and patches at arms and hands - Benign - Related to trauma, age, sun damage and/or use of blood thinners, chronic use of topical and/or oral steroids - Observe - Can use OTC arnica containing moisturizer such as Dermend Bruise Formula if desired - Call for worsening or other concerns   Return in about 5 months (around 10/07/2024) for AK Follow Up.  I, Darcie Easterly, CMA, am acting as scribe for Celine Collard, MD.   Documentation: I have reviewed the above documentation for accuracy and completeness, and I agree with the above.  Celine Collard, MD

## 2024-05-13 DIAGNOSIS — E119 Type 2 diabetes mellitus without complications: Secondary | ICD-10-CM | POA: Diagnosis not present

## 2024-05-13 DIAGNOSIS — I2510 Atherosclerotic heart disease of native coronary artery without angina pectoris: Secondary | ICD-10-CM | POA: Diagnosis not present

## 2024-05-13 DIAGNOSIS — E782 Mixed hyperlipidemia: Secondary | ICD-10-CM | POA: Diagnosis not present

## 2024-05-13 DIAGNOSIS — I10 Essential (primary) hypertension: Secondary | ICD-10-CM | POA: Diagnosis not present

## 2024-05-14 DIAGNOSIS — H2513 Age-related nuclear cataract, bilateral: Secondary | ICD-10-CM | POA: Diagnosis not present

## 2024-05-14 DIAGNOSIS — E119 Type 2 diabetes mellitus without complications: Secondary | ICD-10-CM | POA: Diagnosis not present

## 2024-05-15 ENCOUNTER — Ambulatory Visit
Admission: RE | Admit: 2024-05-15 | Discharge: 2024-05-15 | Disposition: A | Discharge status: Home / Self Care | Source: Ambulatory Visit | Attending: Nurse Practitioner | Admitting: Nurse Practitioner

## 2024-05-15 DIAGNOSIS — I4510 Unspecified right bundle-branch block: Secondary | ICD-10-CM | POA: Insufficient documentation

## 2024-05-15 DIAGNOSIS — E119 Type 2 diabetes mellitus without complications: Secondary | ICD-10-CM | POA: Insufficient documentation

## 2024-05-15 DIAGNOSIS — I10 Essential (primary) hypertension: Secondary | ICD-10-CM | POA: Insufficient documentation

## 2024-05-15 DIAGNOSIS — I38 Endocarditis, valve unspecified: Secondary | ICD-10-CM | POA: Diagnosis not present

## 2024-05-15 DIAGNOSIS — I119 Hypertensive heart disease without heart failure: Secondary | ICD-10-CM | POA: Diagnosis not present

## 2024-05-15 DIAGNOSIS — E782 Mixed hyperlipidemia: Secondary | ICD-10-CM | POA: Diagnosis not present

## 2024-05-15 MED ORDER — REGADENOSON 0.4 MG/5ML IV SOLN
0.4000 mg | Freq: Once | INTRAVENOUS | Status: AC
  Administered 2024-05-15: 0.4 mg via INTRAVENOUS

## 2024-05-15 MED ORDER — TECHNETIUM TC 99M TETROFOSMIN IV KIT
10.8900 | PACK | Freq: Once | INTRAVENOUS | Status: AC | PRN
  Administered 2024-05-15: 10.89 via INTRAVENOUS

## 2024-05-15 MED ORDER — TECHNETIUM TC 99M TETROFOSMIN IV KIT
32.7300 | PACK | Freq: Once | INTRAVENOUS | Status: AC | PRN
  Administered 2024-05-15: 32.73 via INTRAVENOUS

## 2024-05-16 LAB — NM MYOCAR MULTI W/SPECT W/WALL MOTION / EF
Estimated workload: 1
Exercise duration (min): 1 min
Exercise duration (sec): 0 s
LV dias vol: 108 mL (ref 62–150)
LV sys vol: 56 mL (ref 4.2–5.8)
MPHR: 150 {beats}/min
Nuc Stress EF: 48 %
Peak HR: 79 {beats}/min
Percent HR: 52 %
Rest HR: 58 {beats}/min
Rest Nuclear Isotope Dose: 10.9 mCi
SDS: 0
SRS: 1
SSS: 1
ST Depression (mm): 0 mm
Stress Nuclear Isotope Dose: 32.7 mCi
TID: 1.01

## 2024-05-19 DIAGNOSIS — I517 Cardiomegaly: Secondary | ICD-10-CM | POA: Diagnosis not present

## 2024-05-19 DIAGNOSIS — E119 Type 2 diabetes mellitus without complications: Secondary | ICD-10-CM | POA: Diagnosis not present

## 2024-05-19 DIAGNOSIS — E782 Mixed hyperlipidemia: Secondary | ICD-10-CM | POA: Diagnosis not present

## 2024-05-19 DIAGNOSIS — I2510 Atherosclerotic heart disease of native coronary artery without angina pectoris: Secondary | ICD-10-CM | POA: Diagnosis not present

## 2024-05-19 DIAGNOSIS — I10 Essential (primary) hypertension: Secondary | ICD-10-CM | POA: Diagnosis not present

## 2024-05-19 DIAGNOSIS — I77810 Thoracic aortic ectasia: Secondary | ICD-10-CM | POA: Diagnosis not present

## 2024-05-19 DIAGNOSIS — I119 Hypertensive heart disease without heart failure: Secondary | ICD-10-CM | POA: Diagnosis not present

## 2024-05-19 DIAGNOSIS — I4510 Unspecified right bundle-branch block: Secondary | ICD-10-CM | POA: Diagnosis not present

## 2024-06-11 DIAGNOSIS — D649 Anemia, unspecified: Secondary | ICD-10-CM | POA: Diagnosis not present

## 2024-06-20 DIAGNOSIS — D649 Anemia, unspecified: Secondary | ICD-10-CM | POA: Diagnosis not present

## 2024-10-06 DIAGNOSIS — M545 Low back pain, unspecified: Secondary | ICD-10-CM | POA: Diagnosis not present

## 2024-10-06 DIAGNOSIS — R300 Dysuria: Secondary | ICD-10-CM | POA: Diagnosis not present

## 2024-10-06 DIAGNOSIS — R42 Dizziness and giddiness: Secondary | ICD-10-CM | POA: Diagnosis not present

## 2024-10-06 DIAGNOSIS — R066 Hiccough: Secondary | ICD-10-CM | POA: Diagnosis not present

## 2024-10-14 ENCOUNTER — Ambulatory Visit: Admitting: Dermatology

## 2024-10-14 DIAGNOSIS — Z872 Personal history of diseases of the skin and subcutaneous tissue: Secondary | ICD-10-CM | POA: Diagnosis not present

## 2024-10-14 DIAGNOSIS — L821 Other seborrheic keratosis: Secondary | ICD-10-CM

## 2024-10-14 DIAGNOSIS — W908XXA Exposure to other nonionizing radiation, initial encounter: Secondary | ICD-10-CM | POA: Diagnosis not present

## 2024-10-14 DIAGNOSIS — L578 Other skin changes due to chronic exposure to nonionizing radiation: Secondary | ICD-10-CM | POA: Diagnosis not present

## 2024-10-14 DIAGNOSIS — Z7189 Other specified counseling: Secondary | ICD-10-CM

## 2024-10-14 NOTE — Patient Instructions (Addendum)

## 2024-10-14 NOTE — Progress Notes (Unsigned)
   Follow-Up Visit   Subjective  Daryl Gonzalez is a 68 y.o. male who presents for the following:  Here for 5 month ak followup  Patient used 5 f/u calcipotriene cream to scalp and forehead in July 5 f/u but states hurt and became infected   The patient has spots, moles and lesions to be evaluated, some may be new or changing and the patient may have concern these could be cancer.  The following portions of the chart were reviewed this encounter and updated as appropriate: medications, allergies, medical history  Review of Systems:  No other skin or systemic complaints except as noted in HPI or Assessment and Plan.  Objective  Well appearing patient in no apparent distress; mood and affect are within normal limits.  A focused examination was performed of the following areas: Scalp and face   Relevant exam findings are noted in the Assessment and Plan.    Assessment & Plan   SEBORRHEIC KERATOSIS - Stuck-on, waxy, tan-brown papules and/or plaques  - Benign-appearing - Discussed benign etiology and prognosis. - Observe - Call for any changes  ACTINIC DAMAGE - chronic, secondary to cumulative UV radiation exposure/sun exposure over time - diffuse scaly erythematous macules with underlying dyspigmentation - Recommend daily broad spectrum sunscreen SPF 30+ to sun-exposed areas, reapply every 2 hours as needed.  - Recommend staying in the shade or wearing long sleeves, sun glasses (UVA+UVB protection) and wide brim hats (4-inch brim around the entire circumference of the hat). - Call for new or changing lesions.  HISTORY OF PRECANCEROUS ACTINIC KERATOSIS Clear today Discussed if more in future may need to use 5 f/u fluorouracil   calcipotriene  cream  - site(s) of PreCancerous Actinic Keratosis clear today. - these may recur and new lesions may form requiring treatment to prevent transformation into skin cancer - observe for new or changing spots and contact [facility]  for appointment if occur - photoprotection with sun protective clothing; sunglasses and broad spectrum sunscreen with SPF of at least 30 + and frequent self skin exams recommended - yearly exams by a dermatologist recommended for persons with history of PreCancerous Actinic Keratoses   Return in about 1 year (around 10/14/2025) for TBSE hx of aks.  IEleanor Blush, CMA, am acting as scribe for Alm Rhyme, MD.   Documentation: I have reviewed the above documentation for accuracy and completeness, and I agree with the above.  Alm Rhyme, MD

## 2024-10-15 ENCOUNTER — Encounter: Payer: Self-pay | Admitting: Dermatology

## 2025-10-15 ENCOUNTER — Ambulatory Visit: Admitting: Dermatology
